# Patient Record
Sex: Female | Born: 1985 | Hispanic: Yes | Marital: Married | Smoking: Never smoker
Health system: Southern US, Community
[De-identification: ages and names within clinical notes are randomized; demographics above are authoritative.]

## PROBLEM LIST (undated history)

## (undated) DIAGNOSIS — N970 Female infertility associated with anovulation: Secondary | ICD-10-CM

## (undated) DIAGNOSIS — N83209 Unspecified ovarian cyst, unspecified side: Secondary | ICD-10-CM

## (undated) HISTORY — DX: Unspecified ovarian cyst, unspecified side: N83.209

## (undated) HISTORY — DX: Female infertility associated with anovulation: N97.0

---

## 2005-01-29 ENCOUNTER — Ambulatory Visit: Payer: Self-pay | Admitting: Internal Medicine

## 2009-11-16 DIAGNOSIS — O321XX0 Maternal care for breech presentation, not applicable or unspecified: Secondary | ICD-10-CM

## 2010-04-21 ENCOUNTER — Ambulatory Visit: Payer: Self-pay | Admitting: Obstetrics and Gynecology

## 2010-06-06 ENCOUNTER — Ambulatory Visit: Payer: Self-pay | Admitting: Obstetrics and Gynecology

## 2010-06-09 ENCOUNTER — Inpatient Hospital Stay: Payer: Self-pay | Admitting: Obstetrics and Gynecology

## 2014-07-30 ENCOUNTER — Emergency Department: Payer: Self-pay | Admitting: Emergency Medicine

## 2014-07-30 LAB — CBC
HCT: 41.8 % (ref 35.0–47.0)
HGB: 13.7 g/dL (ref 12.0–16.0)
MCH: 29.7 pg (ref 26.0–34.0)
MCHC: 32.8 g/dL (ref 32.0–36.0)
MCV: 91 fL (ref 80–100)
Platelet: 229 10*3/uL (ref 150–440)
RBC: 4.61 10*6/uL (ref 3.80–5.20)
RDW: 13.2 % (ref 11.5–14.5)
WBC: 9 10*3/uL (ref 3.6–11.0)

## 2014-07-30 LAB — HCG, QUANTITATIVE, PREGNANCY: BETA HCG, QUANT.: 218 m[IU]/mL — AB

## 2014-07-31 LAB — COMPREHENSIVE METABOLIC PANEL
ALBUMIN: 4.2 g/dL (ref 3.4–5.0)
ANION GAP: 12 (ref 7–16)
Alkaline Phosphatase: 58 U/L
BUN: 12 mg/dL (ref 7–18)
Bilirubin,Total: 0.4 mg/dL (ref 0.2–1.0)
CALCIUM: 9.1 mg/dL (ref 8.5–10.1)
CREATININE: 0.69 mg/dL (ref 0.60–1.30)
Chloride: 105 mmol/L (ref 98–107)
Co2: 26 mmol/L (ref 21–32)
EGFR (African American): >60
EGFR (Non-African Amer.): >60
GLUCOSE: 81 mg/dL (ref 65–99)
Osmolality: 284 (ref 275–301)
POTASSIUM: 4.1 mmol/L (ref 3.5–5.1)
SGOT(AST): 27 U/L (ref 15–37)
SGPT (ALT): 16 U/L
Sodium: 143 mmol/L (ref 136–145)
Total Protein: 7.4 g/dL (ref 6.4–8.2)

## 2014-08-03 ENCOUNTER — Emergency Department: Payer: Self-pay | Admitting: Emergency Medicine

## 2014-08-03 LAB — HCG, QUANTITATIVE, PREGNANCY: Beta Hcg, Quant.: 185 m[IU]/mL — ABNORMAL HIGH

## 2014-08-06 ENCOUNTER — Other Ambulatory Visit: Payer: Self-pay | Admitting: Obstetrics and Gynecology

## 2014-08-06 LAB — HCG, QUANTITATIVE, PREGNANCY: Beta Hcg, Quant.: 155 m[IU]/mL — ABNORMAL HIGH

## 2014-08-14 ENCOUNTER — Other Ambulatory Visit: Payer: Self-pay | Admitting: Obstetrics and Gynecology

## 2014-08-14 LAB — HCG, QUANTITATIVE, PREGNANCY: Beta Hcg, Quant.: 22 m[IU]/mL — ABNORMAL HIGH

## 2014-08-16 ENCOUNTER — Encounter: Payer: Self-pay | Admitting: Obstetrics and Gynecology

## 2014-08-21 LAB — HCG, QUANTITATIVE, PREGNANCY: Beta Hcg, Quant.: 2 m[IU]/mL

## 2014-09-16 ENCOUNTER — Encounter: Payer: Self-pay | Admitting: Obstetrics and Gynecology

## 2015-03-09 NOTE — Consult Note (Signed)
PATIENT NAME:  Destiny Allen, Mayda MR#:  [id number] DATE OF BIRTH:  Jul 18, 1986  DATE OF CONSULTATION:  07/31/2014  REFERRING PHYSICIAN:   CONSULTING PHYSICIAN:  Suzy Bouchardhomas J. Lindsi Bayliss, MD  ATTENDING PHYSICIAN:  Enedina Finnerandolph N. Manson PasseyBrown, MD  HISTORY OF PRESENT ILLNESS:  This is a 29 year old gravida 2, para 1, patient's last menstrual period of 05/10/2014, being [facility] for abnormal rises in beta-hCG. The patient was seen the day prior to presenting to the Emergency Department. The patient has had vaginal bleeding since 08/25,  darkish brown bleeding. The patient developed some vague left lower quadrant pain over the last few days. Ultrasound performed on 07/31/2014, that showed an empty uterus. No gestational sac. No yolk sac. No fetal pole. There was a 1.6 x 1.3 x 1.3 cm mass adjacent to the left ovary, also with the appearance of a gestational sac and embryo without cardiac motion noted. There is moderate free pelvic fluid noticed as well. Quantitative hCGs starting on 07/24/2014 equals 269, Repeat on 07/26/2014 was 427, followed by repeat on 07/29/2014 equals 317, and repeat this morning on 07/31/2014 equals 218. Blood type is A+. Hematocrit is 41.8, platelets 229,000.   PAST MEDICAL HISTORY: Unremarkable.   PAST SURGICAL HISTORY: Cesarean section.   REVIEW OF SYSTEMS: Unremarkable.   MEDICATIONS: Prenatal vitamins.   SOCIAL HISTORY: Does not smoke, does not drink.   PHYSICAL EXAMINATION:  GENERAL: Well-developed, well-nourished Hispanic female in no acute distress.  VITAL SIGNS: Temperature 97.8, blood pressure 115/86, pulse of 81, pulse oximetry of 100%.  LUNGS: Clear to auscultation.  CARDIOVASCULAR: Regular rate and rhythm. Soft, nontender. No rebound tenderness noted.  PELVIC: Bimanual pelvic exam: Cervix: No lesions. Uterus approximately 6 weeks in size, slightly tender on the left. No mass appreciated. Brown blood on examining glove.  RECTAL: Deferred.   ASSESSMENT:  Probable left fallopian tube ectopic pregnancy.   PLAN: Discussed with the patient the options of methotrexate treatment. The patient does meet the criteria. We did briefly speak about the role of surgical intervention. The patient is willing to undergo methotrexate treatment, which will be based on her height of 4 feet 11 inches, her weight of 122, BSA of 1.52, and methotrexate dosing of 50 mg per BSA is equal to 76 mg intramuscular. The patient will undergo a comprehensive metabolic panel to ensure she has no active liver disease and has normal renal function before if administration. The patient will have a repeat beta-hCG on 08/04/2014, and then a repeat hCG level on 08/07/2014, looking for at least a 15% decrease from day 4 through 7 hCG. The patient is given strict precautions if she has significant change in her pelvic pain to return to the Emergency Department. The patient is aware of the failure rate of methotrexate, approximately 5 to 10%, which would require either repeat methotrexate dosing versus surgical intervention. The patient is instructed to stop all prenatal vitamins and avoid green leafy vegetables in her diet until resolved of this ectopic pregnancy. All questions have been answered. Dr. Manson PasseyBrown has been given the further instructions for blood testing and methotrexate dosing.    ____________________________ Suzy Bouchardhomas J. Haruye Lainez, MD tjs:ts D: 07/31/2014 02:55:48 ET T: 07/31/2014 03:53:53 ET JOB#: [id number]  cc: Suzy Bouchardhomas J. Pearley Baranek, MD, <Dictator> Suzy BouchardHOMAS J Naturi Alarid MD ELECTRONICALLY SIGNED 07/31/2014 22:08

## 2015-09-19 ENCOUNTER — Ambulatory Visit (INDEPENDENT_AMBULATORY_CARE_PROVIDER_SITE_OTHER): Payer: Managed Care, Other (non HMO) | Admitting: Obstetrics and Gynecology

## 2015-09-19 ENCOUNTER — Encounter: Payer: Self-pay | Admitting: Obstetrics and Gynecology

## 2015-09-19 VITALS — BP 115/73 | HR 97 | Ht 59.0 in | Wt 127.3 lb

## 2015-09-19 DIAGNOSIS — N926 Irregular menstruation, unspecified: Secondary | ICD-10-CM

## 2015-09-19 DIAGNOSIS — N970 Female infertility associated with anovulation: Secondary | ICD-10-CM | POA: Diagnosis not present

## 2015-09-19 MED ORDER — MEDROXYPROGESTERONE ACETATE 10 MG PO TABS: 10.0000 mg | ORAL_TABLET | Freq: Every day | ORAL | Status: DC

## 2015-09-19 NOTE — Patient Instructions (Signed)
1. You will begin a medication called Provera.  You will take this medication for 7 days each month.  Begin next dose approximately 21 days from initial dose each month.  2. You will need to keep a menstrual calendar.  Be sure to mark the first day of your cycle each month.  3. On the 3rd day of your cycle (3rd day of bleeding) and 21st day of your cycle, you will need to return for lab work.  4. You will be scheduled for an ultrasound and and HSG (Hysterosalpingogram) to evaluate your pelvic organs and ensure no tubal blockage.  5. Once you have begun having regular cycles, you will need to be sure to have intercourse at least once daily specifically on Days 11-14 of your cycle.  Do not immediately get up after intercourse, remain lying down for 30 minutes.  6.  If all other labs are normal, your partner may need to undergo a semen analysis (sperm test)

## 2015-09-21 NOTE — Progress Notes (Signed)
GYNECOLOGY CLINIC PROGRESS NOTE  Subjective:    Destiny Allen is a 29 y.o. 292P1011 female who presents for evaluation of infertility. Patient and partner have been attempting conception for 1 year. Marital status: married for 8 years. Pregnancies with current partner: yes.    Menstrual and Endocrine History LMP Patient's last menstrual period was 07/22/2015.  Menarche 10  Shortest interval 28  Longest interval 60  days  Duration of flow 4 days  Heavy menses no  Clots no  Intermenstrual bleeding no  Postcoital bleeding no  Dysmenorrhea yes  Amenorrhea yes for 2 cycles  Weight change no  Hirsutism no  Balding no  Acne no  Galactorrhea no   Obstetrical History History: Ectopic pregnancies: 1 D&Cs: none Years took to conceive last pregnancy: 6 months   Obstetric History   G2   P1   T1   P0   A1   TAB0   SAB1   E0   M0   L1     # Outcome Date GA Lbr Len/2nd Weight Sex Delivery Anes PTL Lv  2 SAB 07/17/14        ND  1 Term 2011 9060w0d  7 lb (3.175 kg) M CS-Unspec   Y      Gynecologic History Last PAP 2 year ago  Previous abdominal or pelvic surgery yes  Pelvic pain no  Endometriosis no  Hot flashes no  DES exposure no  Abnormal Pap no  Cervix Cryo/cone no  Sexually transmitted diseases no  Pelvic inflammatory disease no   Infertility and Endocrine Studies Patient has not sought care previously for infertility. No h/o previous studies.   Sexual History Frequency 1 or 2 times per week  Satisfied yes  Dyspareunia no  Use of lubricant no  Douching no   Contraception None   Family History Thyroid problems  no  Heart condition or high blood pressure  yes  Blood clot or stroke  no  Diabetes  no  Cancer  no  Birth defects/inherited diseases  no  Infectious diseases (mumps, TB, rubella)  no  Other medical problems  no   Habits Cigarettes:    Wife -  no    Husband - no Alcohol:    Wife -  no    Husband - occasional (social drinker) Marijuana:   Wife -  no   Husband - no  The following portions of the patient's history were reviewed and updated as appropriate: allergies, current medications, past family history, past medical history, past social history, past surgical history and problem list.   Review of Systems A comprehensive review of systems was negative.   Female History and Exam Age: [age] Education: [employer]  Paternity of Pregnancies: Number with this partner: 1 Number with other partners: 0 Age of youngest child: 5 years  Urologic History: Infection no  STD no  Mumps no  Varicocele no  Semen analysis no  Undescended testes no  Testicular trauma no  Genital surgery no  Ejaculatory problem no  Impotence no         Objective:    Female Exam BP 115/73 mmHg  Pulse 97  Ht 4\' 11"  (1.499 m)  Wt 127 lb 4.8 oz (57.743 kg)  BMI 25.70 kg/m2  LMP 07/22/2015 Wt Readings from Last 1 Encounters:  09/19/15 127 lb 4.8 oz (57.743 kg)   BMI: Body mass index is 25.7 kg/(m^2). BP 115/73 mmHg  Pulse 97  Ht 4\' 11"  (1.499 m)  Wt 127 lb  4.8 oz (57.743 kg)  BMI 25.70 kg/m2  LMP 07/22/2015  General Appearance:    Alert, cooperative, no distress, appears stated age  Head:    Normocephalic, without obvious abnormality, atraumatic  Eyes:    PERRL, conjunctiva/corneas clear, EOM's intact, both eyes  Ears:    Normal external ear canals, both ears  Nose:   Nares normal, septum midline, mucosa normal, no drainage    or sinus tenderness  Throat:   Lips, mucosa, and tongue normal; teeth and gums normal  Neck:   Supple, symmetrical, trachea midline, no adenopathy; thyroid:  No enlargement/tenderness/nodules; no carotid bruit or JVD  Back:     Symmetric, no curvature, ROM normal, no CVA tenderness  Lungs:     Clear to auscultation bilaterally, respirations unlabored  Chest Wall:    No tenderness or deformity   Heart:    Regular rate and rhythm, S1 and S2 normal, no murmur, rub or gallop  Abdomen:     Soft, non-tender, bowel sounds  active all four quadrants, no masses, no organomegaly. Well healed Pfannenstiel incision.   Genitalia:    Normal female external genitalia without lesion. Vagina without discharge, normal mucosa. Cervix normal without lesions or tenderness. Uterus normal size and shape, mobile. Adnexae without masses or tenderness.   Rectal:    Normal external sphincter, no hemorrhoids.  Internal exam not performed.   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   H/o previous left ectopic pregnancy, treated with Methotrexate  Plan:   UPT ordered.  To start on Provera 10 mg for regulation of menstrual cycles.  Encouraged to keep a menstrual calendar.  FSH, LH, Progesterone 3 times in midluteal phase.  Will also order labs to r/o PCOS in light of patient's h/o irregular menses/anovulation. Ultrasound of pelvis. HSG to assess tubal patency Further management will depend upon the results of the above tests/procedures.  May include semen analysis, Follow up in 2 months after all studies complete.  Hildred Laser, MD [facility]

## 2015-11-19 ENCOUNTER — Ambulatory Visit: Payer: Managed Care, Other (non HMO) | Admitting: Obstetrics and Gynecology

## 2016-03-10 IMAGING — US US OB < 14 WEEKS - US OB TV
1 series · 13 of 28 positions shown · non-contrast
Comparison: None.

CLINICAL DATA: Vaginal bleeding and cramping. Quantitative beta HCG
weeks 5 days by LMP.

EXAM:
OBSTETRIC <14 WK US AND TRANSVAGINAL OB US
TECHNIQUE: Both transabdominal and transvaginal ultrasound examinations were
performed for complete evaluation of the gestation as well as the
maternal uterus, adnexal regions, and pelvic cul-de-sac.
Transvaginal technique was performed to assess early pregnancy.

[Series 1: us ob < 14 weeks - us ob tv · 0.17mm/px · 13 of 51 slices shown]
[im 2/51]
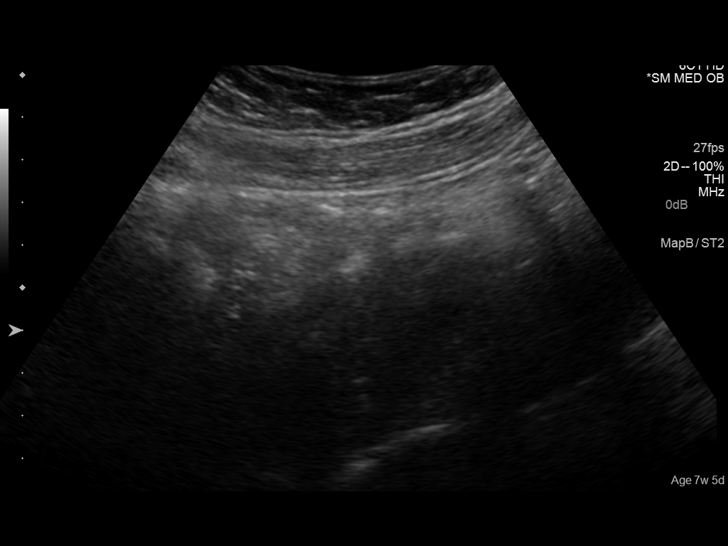
[im 6/51]
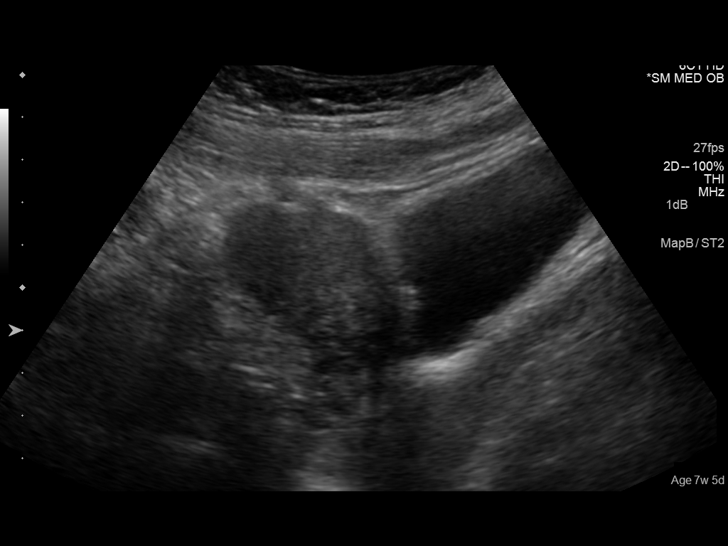
[im 10/51]
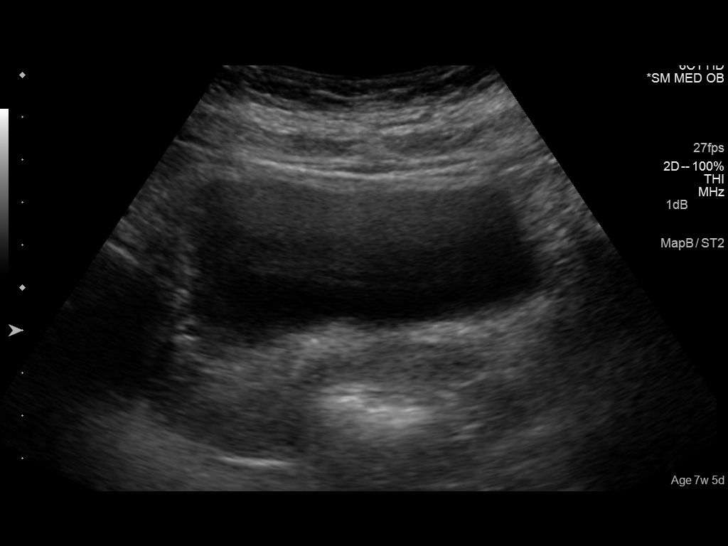
[im 13/51]
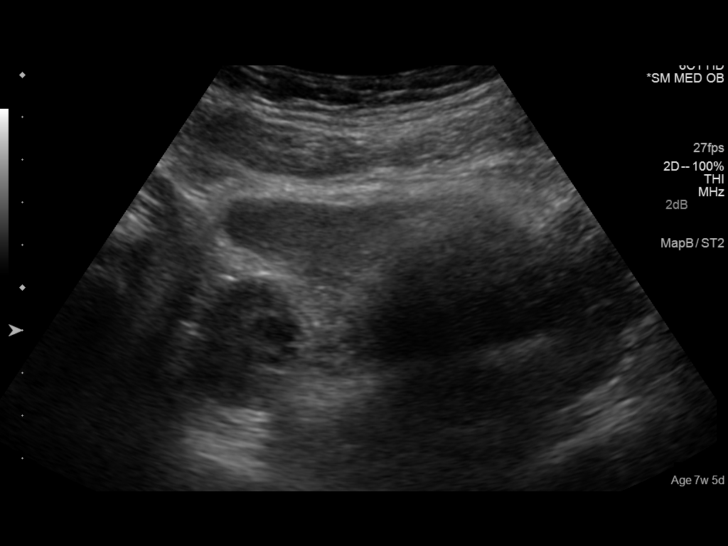
[im 17/51]
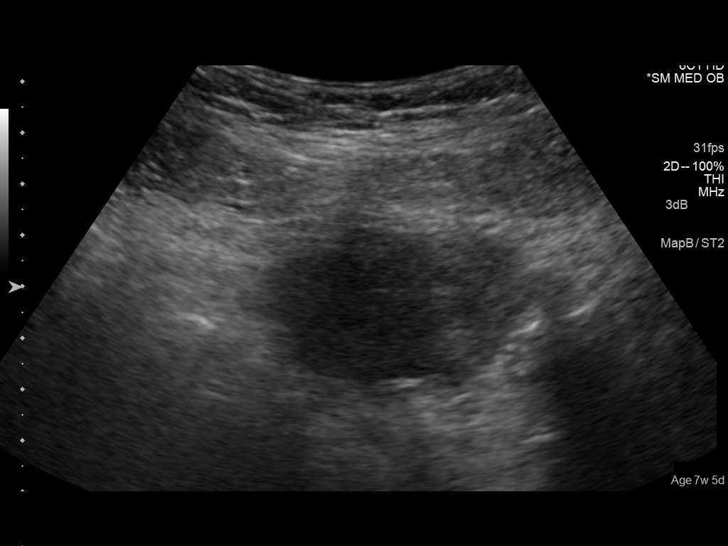
[im 21/51]
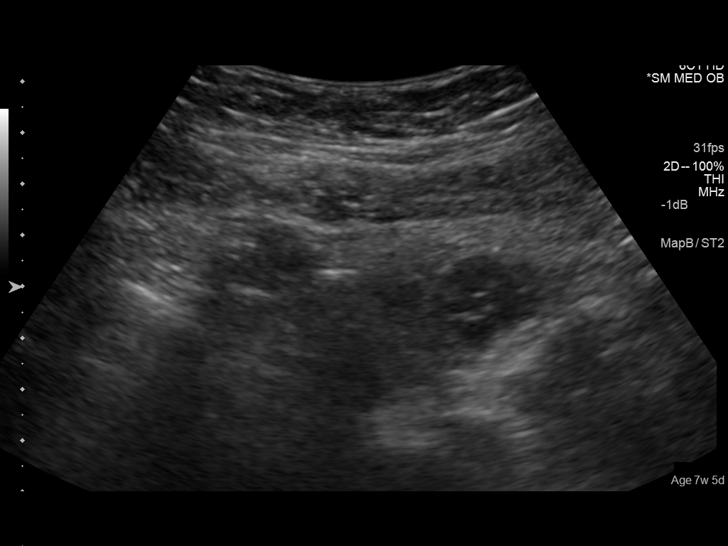
[im 26/51]
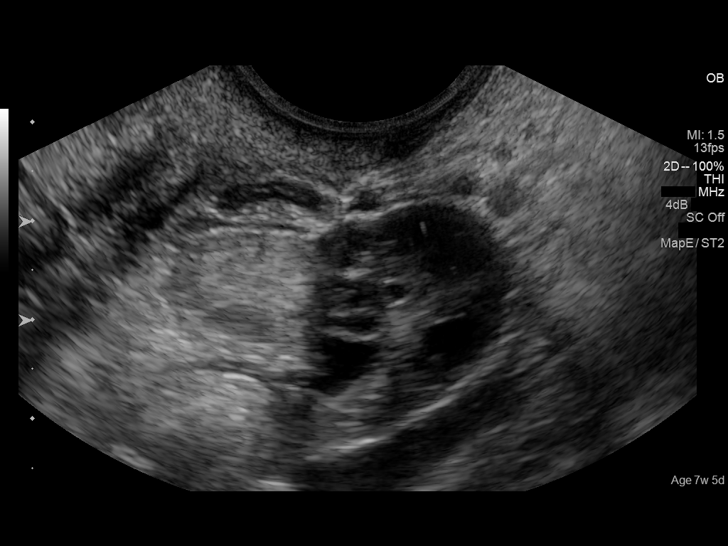
[im 30/51]
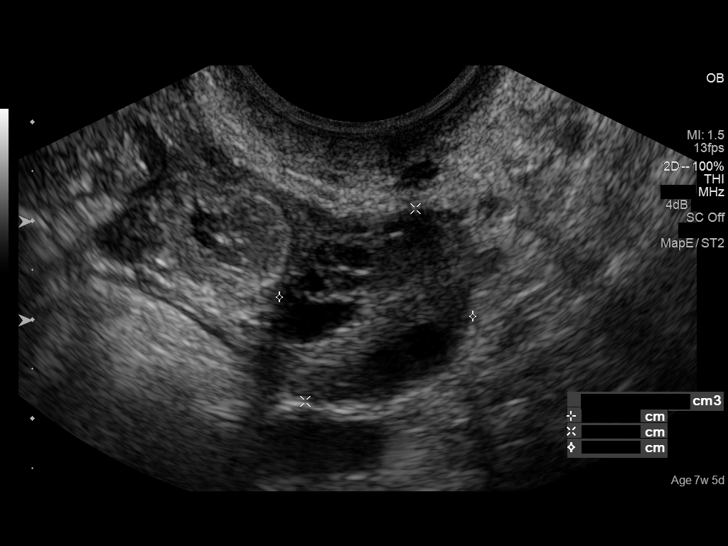
[im 34/51]
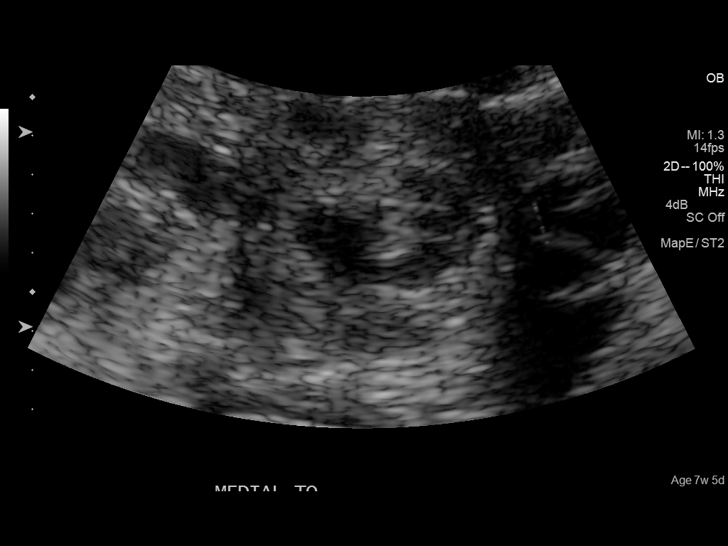
[im 38/51]
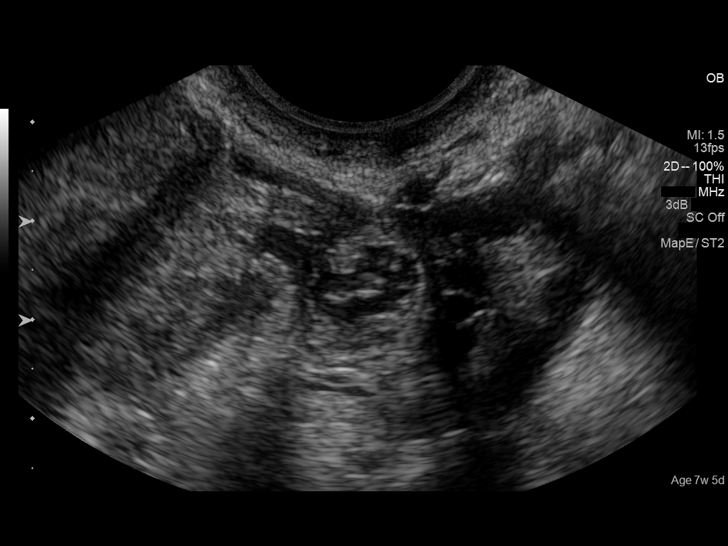
[im 41/51]
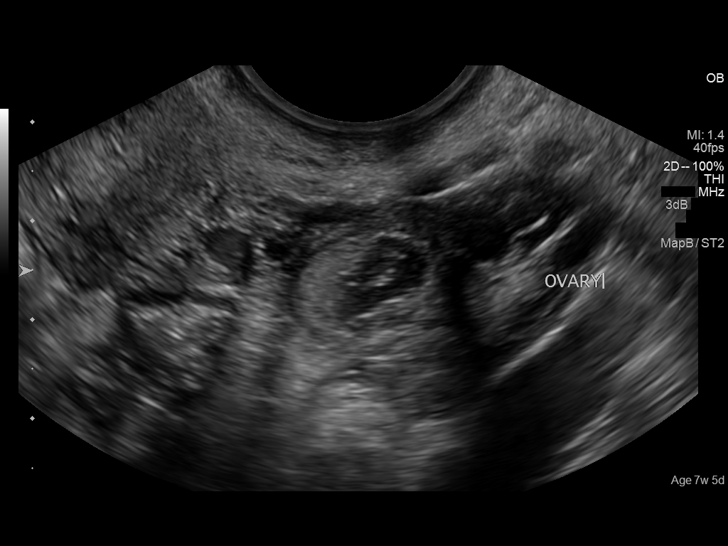
[im 45/51]
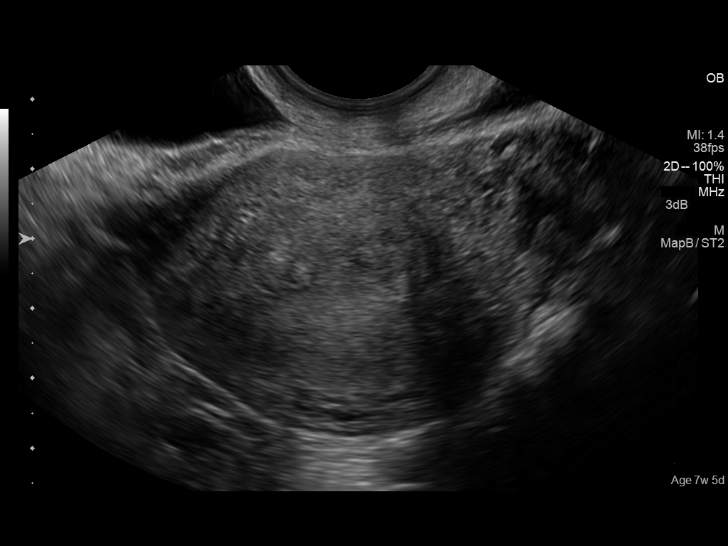
[im 49/51]
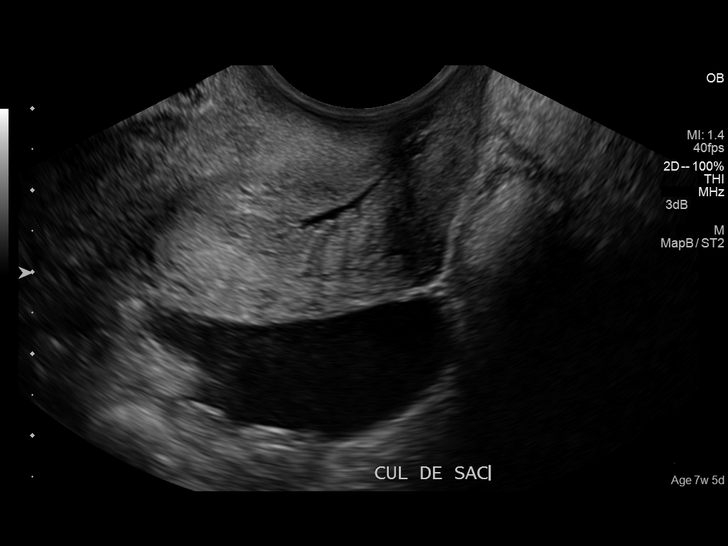

[13 of 28 positions shown; findings below may reference images not displayed]

FINDINGS: Intrauterine gestational sac: None

Yolk sac:  None

Embryo:  None

Maternal uterus/adnexae: Adjacent to the left ovary there is mass
suspicious for ectopic pregnancy which measures 1.6 x 1.3 x 1.3 cm.
Within this mass there is the appearance of a gestational sac and
embryo without cardiac motion. The adjacent left ovary has a normal
appearance. The right ovary has a normal appearance. There is
moderate free pelvic fluid which appears anechoic
IMPRESSION: 1. No intrauterine pregnancy.
2. Suspicious left adnexal mass raising the question of ectopic
pregnancy. Overall the mass measures 1.6 cm.
3. Moderate free pelvic fluid.
4. Critical Value/emergent results were called by telephone at the
time of interpretation on 07/31/2014 at [DATE] to Dr. KANORI
FONDONG , who verbally acknowledged these results.

## 2016-04-06 ENCOUNTER — Telehealth: Payer: Self-pay

## 2016-04-06 NOTE — Telephone Encounter (Signed)
Received a message from Dr. Vergie LivingPickens, would like patient seen in the office, called patient to schedule this appointment, no answer, left voice mail instructing patient to return my call here at the office.

## 2016-04-08 ENCOUNTER — Telehealth: Payer: Self-pay

## 2016-04-08 NOTE — Telephone Encounter (Signed)
Received a message from Dr. Vergie LivingPickens to contact patient to schedule an appt at our office around June. Called patient, no answer, unable to leave voice mail "mail is full"

## 2016-04-14 ENCOUNTER — Telehealth: Payer: Self-pay

## 2016-04-14 NOTE — Telephone Encounter (Signed)
Following up on a request from Dr. Vergie LivingPickens to contact patient to schedule an appointment, called patient, no answer, unable to leave message, "mailbox full"

## 2016-06-11 ENCOUNTER — Other Ambulatory Visit: Payer: Self-pay | Admitting: Obstetrics and Gynecology

## 2016-06-11 DIAGNOSIS — O3680X0 Pregnancy with inconclusive fetal viability, not applicable or unspecified: Secondary | ICD-10-CM

## 2016-06-16 ENCOUNTER — Ambulatory Visit
Admission: RE | Admit: 2016-06-16 | Discharge: 2016-06-16 | Disposition: A | Discharge status: Home / Self Care | Payer: 59 | Source: Ambulatory Visit | Attending: Obstetrics and Gynecology | Admitting: Obstetrics and Gynecology

## 2016-06-16 DIAGNOSIS — Z8759 Personal history of other complications of pregnancy, childbirth and the puerperium: Secondary | ICD-10-CM | POA: Diagnosis present

## 2016-06-16 DIAGNOSIS — Z331 Pregnant state, incidental: Secondary | ICD-10-CM | POA: Insufficient documentation

## 2016-06-16 DIAGNOSIS — O3480 Maternal care for other abnormalities of pelvic organs, unspecified trimester: Secondary | ICD-10-CM | POA: Diagnosis not present

## 2016-06-16 DIAGNOSIS — O3680X0 Pregnancy with inconclusive fetal viability, not applicable or unspecified: Secondary | ICD-10-CM | POA: Diagnosis not present

## 2016-06-16 DIAGNOSIS — Z3A00 Weeks of gestation of pregnancy not specified: Secondary | ICD-10-CM | POA: Insufficient documentation

## 2016-06-17 ENCOUNTER — Other Ambulatory Visit
Admission: RE | Admit: 2016-06-17 | Discharge: 2016-06-17 | Disposition: A | Discharge status: Home / Self Care | Payer: 59 | Source: Ambulatory Visit | Attending: Obstetrics and Gynecology | Admitting: Obstetrics and Gynecology

## 2016-06-17 DIAGNOSIS — O001 Tubal pregnancy without intrauterine pregnancy: Secondary | ICD-10-CM | POA: Insufficient documentation

## 2016-06-17 LAB — CBC
HCT: 37.3 % (ref 35.0–47.0)
Hemoglobin: 12.7 g/dL (ref 12.0–16.0)
MCH: 30 pg (ref 26.0–34.0)
MCHC: 34 g/dL (ref 32.0–36.0)
MCV: 88.1 fL (ref 80.0–100.0)
PLATELETS: 207 10*3/uL (ref 150–440)
RBC: 4.24 MIL/uL (ref 3.80–5.20)
RDW: 13.7 % (ref 11.5–14.5)
WBC: 8.7 10*3/uL (ref 3.6–11.0)

## 2016-06-17 LAB — COMPREHENSIVE METABOLIC PANEL
ALT: 22 U/L (ref 14–54)
AST: 25 U/L (ref 15–41)
Albumin: 4.6 g/dL (ref 3.5–5.0)
Alkaline Phosphatase: 48 U/L (ref 38–126)
Anion gap: 9 (ref 5–15)
BUN: 9 mg/dL (ref 6–20)
CHLORIDE: 103 mmol/L (ref 101–111)
CO2: 26 mmol/L (ref 22–32)
Calcium: 9.6 mg/dL (ref 8.9–10.3)
Creatinine, Ser: 0.61 mg/dL (ref 0.44–1.00)
Glucose, Bld: 90 mg/dL (ref 65–99)
POTASSIUM: 4 mmol/L (ref 3.5–5.1)
SODIUM: 138 mmol/L (ref 135–145)
Total Bilirubin: 0.9 mg/dL (ref 0.3–1.2)
Total Protein: 7.2 g/dL (ref 6.5–8.1)

## 2016-06-17 LAB — HCG, QUANTITATIVE, PREGNANCY: hCG, Beta Chain, Quant, S: 24852 m[IU]/mL — ABNORMAL HIGH (ref <5)

## 2016-06-23 ENCOUNTER — Telehealth: Payer: Self-pay | Admitting: *Deleted

## 2016-06-23 ENCOUNTER — Other Ambulatory Visit: Payer: Self-pay | Admitting: Internal Medicine

## 2016-06-23 ENCOUNTER — Encounter: Payer: Self-pay | Admitting: *Deleted

## 2016-06-23 DIAGNOSIS — O009 Unspecified ectopic pregnancy without intrauterine pregnancy: Secondary | ICD-10-CM

## 2016-06-23 DIAGNOSIS — Z8759 Personal history of other complications of pregnancy, childbirth and the puerperium: Secondary | ICD-10-CM | POA: Insufficient documentation

## 2016-06-23 NOTE — Telephone Encounter (Signed)
Dr. Donneta RombergBrahmanday spoke with Dr. Jean RosenthalJackson a few mins. Ago. Pt will need methotrexate inj today. Herbert SetaHeather, RN Spoke with Steward DroneBrenda, RN in cancer ctr triage--pending rx to be fax at [phone] .  Steward DroneBrenda will look out for this/ fax for Dr. Donneta RombergBrahmanday. Per Dr. Sharman CrateBrahmanday-labs from 8/2 are acceptable. No need to repeat any additional lab values today.

## 2016-06-23 NOTE — Progress Notes (Signed)
I spoke to Dr.jackson re: pt with ectopic pregnancy; we will await the fax from Dr.Jackson's office re: prescription/dose. I have put orders for methotrexate.   Addendum: Spoke to Dr.Jackson; wants to hold off Mxt treatment today; He will let us know if he want to do it tomorrow. Spoke to Sprint Nextel CorporationKim in infusion.

## 2016-06-23 NOTE — Telephone Encounter (Signed)
Contacted Dr. Edison PaceJackson's office per v/o Dr. Donneta RombergBrahmanday. Obtained ht/wt.  Pt's last height in July 4'11".  Last recorded wt-  133 lbs

## 2016-06-23 NOTE — Progress Notes (Signed)
Contacted [facility]. Pt's last height in July 4'11".  Last recorded wt-  133 lbs

## 2016-06-24 ENCOUNTER — Inpatient Hospital Stay: Payer: 59 | Attending: Internal Medicine

## 2016-06-24 ENCOUNTER — Other Ambulatory Visit: Payer: Self-pay | Admitting: *Deleted

## 2016-06-24 ENCOUNTER — Other Ambulatory Visit: Payer: Self-pay | Admitting: Internal Medicine

## 2016-06-24 VITALS — BP 108/70 | HR 88 | Temp 98.4°F

## 2016-06-24 DIAGNOSIS — Z79899 Other long term (current) drug therapy: Secondary | ICD-10-CM | POA: Insufficient documentation

## 2016-06-24 DIAGNOSIS — O009 Unspecified ectopic pregnancy without intrauterine pregnancy: Secondary | ICD-10-CM

## 2016-06-24 MED ORDER — METHOTREXATE SODIUM (PF) CHEMO INJECTION 250 MG/10ML
50.0000 mg/m2 | Freq: Once | INTRAMUSCULAR | Status: AC
  Administered 2016-06-24: 79 mg via INTRAMUSCULAR
  Filled 2016-06-24: qty 3.16

## 2016-06-24 MED ORDER — PROCHLORPERAZINE MALEATE 10 MG PO TABS
10.0000 mg | ORAL_TABLET | Freq: Once | ORAL | Status: AC
  Administered 2016-06-24: 10 mg via ORAL
  Filled 2016-06-24: qty 1

## 2016-06-27 ENCOUNTER — Other Ambulatory Visit
Admission: RE | Admit: 2016-06-27 | Discharge: 2016-06-27 | Disposition: A | Discharge status: Home / Self Care | Payer: 59 | Source: Ambulatory Visit | Attending: Obstetrics and Gynecology | Admitting: Obstetrics and Gynecology

## 2016-06-27 DIAGNOSIS — O009 Unspecified ectopic pregnancy without intrauterine pregnancy: Secondary | ICD-10-CM | POA: Diagnosis present

## 2016-06-27 LAB — HCG, QUANTITATIVE, PREGNANCY: hCG, Beta Chain, Quant, S: 25896 m[IU]/mL — ABNORMAL HIGH (ref <5)

## 2016-06-30 ENCOUNTER — Other Ambulatory Visit
Admission: RE | Admit: 2016-06-30 | Discharge: 2016-06-30 | Disposition: A | Discharge status: Home / Self Care | Payer: 59 | Source: Ambulatory Visit | Attending: Obstetrics and Gynecology | Admitting: Obstetrics and Gynecology

## 2016-06-30 DIAGNOSIS — O009 Unspecified ectopic pregnancy without intrauterine pregnancy: Secondary | ICD-10-CM | POA: Insufficient documentation

## 2016-06-30 LAB — COMPREHENSIVE METABOLIC PANEL
ALBUMIN: 4.3 g/dL (ref 3.5–5.0)
ALK PHOS: 45 U/L (ref 38–126)
ALT: 23 U/L (ref 14–54)
ANION GAP: 6 (ref 5–15)
AST: 20 U/L (ref 15–41)
BUN: 10 mg/dL (ref 6–20)
CHLORIDE: 103 mmol/L (ref 101–111)
CO2: 26 mmol/L (ref 22–32)
Calcium: 9.5 mg/dL (ref 8.9–10.3)
Creatinine, Ser: 0.51 mg/dL (ref 0.44–1.00)
GFR calc non Af Amer: >60 mL/min (ref >60)
GLUCOSE: 83 mg/dL (ref 65–99)
Potassium: 3.7 mmol/L (ref 3.5–5.1)
SODIUM: 135 mmol/L (ref 135–145)
Total Bilirubin: 0.5 mg/dL (ref 0.3–1.2)
Total Protein: 6.9 g/dL (ref 6.5–8.1)

## 2016-06-30 LAB — CBC
HCT: 35.9 % (ref 35.0–47.0)
HEMOGLOBIN: 12.2 g/dL (ref 12.0–16.0)
MCH: 29.9 pg (ref 26.0–34.0)
MCHC: 33.9 g/dL (ref 32.0–36.0)
MCV: 88.1 fL (ref 80.0–100.0)
PLATELETS: 209 10*3/uL (ref 150–440)
RBC: 4.07 MIL/uL (ref 3.80–5.20)
RDW: 13.5 % (ref 11.5–14.5)
WBC: 7 10*3/uL (ref 3.6–11.0)

## 2016-06-30 LAB — HCG, QUANTITATIVE, PREGNANCY: hCG, Beta Chain, Quant, S: 16616 m[IU]/mL — ABNORMAL HIGH (ref <5)

## 2017-09-27 ENCOUNTER — Ambulatory Visit (INDEPENDENT_AMBULATORY_CARE_PROVIDER_SITE_OTHER): Payer: 59 | Admitting: Advanced Practice Midwife

## 2017-09-27 ENCOUNTER — Encounter: Payer: Self-pay | Admitting: Advanced Practice Midwife

## 2017-09-27 VITALS — BP 122/74 | Wt 136.0 lb

## 2017-09-27 DIAGNOSIS — O0990 Supervision of high risk pregnancy, unspecified, unspecified trimester: Secondary | ICD-10-CM

## 2017-09-27 DIAGNOSIS — Z8632 Personal history of gestational diabetes: Secondary | ICD-10-CM

## 2017-09-27 DIAGNOSIS — Z98891 History of uterine scar from previous surgery: Secondary | ICD-10-CM

## 2017-09-27 DIAGNOSIS — Z113 Encounter for screening for infections with a predominantly sexual mode of transmission: Secondary | ICD-10-CM

## 2017-09-27 NOTE — Progress Notes (Signed)
New Obstetric Patient H&P    Chief Complaint: "Desires prenatal care"   History of Present Illness: Patient is a 31 y.o. 623P1011 Hispanic or Latino female, LMP 07/30/2017 presents with amenorrhea and positive home pregnancy test. Based on her  LMP, her EDD is Estimated Date of Delivery: 05/06/2017. and her EGA is 668w3d. Cycles are 6. days, regular, and occur approximately every : 28 days. Her last pap smear was 9 months ago and was no abnormalities.    She had a urine pregnancy test which was positive 2 week(s)  ago. Her last menstrual period was normal and lasted for  6 or 7 day(s). Since her LMP she claims she has experienced bloating and mild cramping. She denies vaginal bleeding. Her past medical history is noncontributory. Her prior pregnancies are notable for gestational diabetes- diet controlled, c/s for breech presentation  Since her LMP, she admits to the use of tobacco products  no She claims she has gained   4 pounds since the start of her pregnancy.  There are cats in the home in the home  no  She admits close contact with children on a regular basis  yes  She has had chicken pox in the past yes She has had Tuberculosis exposures, symptoms, or previously tested positive for TB   no Current or past history of domestic violence. no  Genetic Screening/Teratology Counseling: (Includes patient, baby's father, or anyone in either family with:)   1. [facility]  no 2. Thalassemia ([place], [place], Mediterranean, or Asian background): MCV<80  no 3. Neural tube defect (meningomyelocele, spina bifida, anencephaly)  no 4. Congenital heart defect  no  5. Down syndrome  no 6. Tay-Sachs (Jewish, [place])  no 7. Canavan's Disease  no 8. Sickle cell disease or trait (African)  no  9. Hemophilia or other blood disorders  no  10. Muscular dystrophy  no  11. Cystic fibrosis  no  12. Huntington's Chorea  no  13. Mental retardation/autism  no 14. Other inherited  genetic or chromosomal disorder  no 15. Maternal metabolic disorder (DM, PKU, etc)  no 16. Patient or FOB with a child with a birth defect not listed above no  16a. Patient or FOB with a birth defect themselves no 17. Recurrent pregnancy loss, or stillbirth  no  18. Any medications since LMP other than prenatal vitamins (include vitamins, supplements, OTC meds, drugs, alcohol)  no 19. Any other genetic/environmental exposure to discuss  no  Infection History:   1. Lives with someone with TB or TB exposed  no  2. Patient or partner has history of genital herpes  no 3. Rash or viral illness since LMP  no 4. History of STI (GC, CT, HPV, syphilis, HIV)  no 5. History of recent travel :  no  Other pertinent information:  Plans to travel to [place] for Christmas/New Year's- Zika precautions given    Review of Systems:10 point review of systems negative unless otherwise noted in HPI  Past Medical History:  Past Medical History:  Diagnosis Date  . Infertility associated with anovulation   . Ovarian cyst     Past Surgical History:  Past Surgical History:  Procedure Laterality Date  . CESAREAN SECTION  2011    Gynecologic History: Patient's last menstrual period was 07/30/2017.  Obstetric History: G3P1011  Family History:  Family History  Problem Relation Age of Onset  . Hypertension Father     Social History:  Social History  Socioeconomic History  . Marital status: Married    Spouse name: Not on file  . Number of children: Not on file  . Years of education: Not on file  . Highest education level: Not on file  Social Needs  . Financial resource strain: Not on file  . Food insecurity - worry: Not on file  . Food insecurity - inability: Not on file  . Transportation needs - medical: Not on file  . Transportation needs - non-medical: Not on file  Occupational History  . Not on file  Tobacco Use  . Smoking status: Never Smoker  . Smokeless tobacco: Never Used    Substance and Sexual Activity  . Alcohol use: No  . Drug use: No  . Sexual activity: Yes    Birth control/protection: None  Other Topics Concern  . Not on file  Social History Narrative  . Not on file    Allergies:  No Known Allergies  Medications: Prior to Admission medications   Medication Sig Start Date End Date Taking? Authorizing Provider  IRON PO Take by mouth.    [provider]  medroxyPROGESTERone (PROVERA) 10 MG tablet Take 1 tablet (10 mg total) by mouth daily. Patient not taking: Reported on 09/27/2017 09/19/15   Hildred Laserherry, Anika, MD    Physical Exam Vitals: Blood pressure 122/74, weight 136 lb (61.7 kg), last menstrual period 07/30/2017.  General: NAD HEENT: normocephalic, anicteric Thyroid: no enlargement, no palpable nodules Pulmonary: No increased work of breathing, CTAB Cardiovascular: RRR, distal pulses 2+ Abdomen: NABS, soft, non-tender, non-distended.  Umbilicus without lesions.  No hepatomegaly, splenomegaly or masses palpable. No evidence of hernia  Genitourinary:  Deferred for no concerns/early gestational age/PAP interval Extremities: no edema, erythema, or tenderness Neurologic: Grossly intact Psychiatric: mood appropriate, affect full   Assessment: 31 y.o. G3P1011 at 5456w3d per LMP presenting to initiate prenatal care  Plan: 1) Avoid alcoholic beverages. 2) Patient encouraged not to smoke.  3) Discontinue the use of all non-medicinal drugs and chemicals.  4) Take prenatal vitamins daily.  5) Nutrition, food safety (fish, cheese advisories, and high nitrite foods) and exercise discussed. 6) Hospital and practice style discussed with cross coverage system.  7) Genetic Screening, such as with 1st Trimester Screening, cell free fetal DNA, AFP testing, and Ultrasound, as well as with amniocentesis and CVS as appropriate, is discussed with patient. At the conclusion of today's visit patient requested genetic testing 8) Patient is asked about  travel to areas at risk for the Zika virus, and counseled to avoid travel and exposure to mosquitoes or sexual partners who may have themselves been exposed to the virus. Testing is discussed, and will be ordered as appropriate.   Tresea MallJane Ether Wolters, CNM

## 2017-09-27 NOTE — Patient Instructions (Addendum)

## 2017-09-27 NOTE — Progress Notes (Signed)
NOB today. No vb. No lof ?

## 2017-09-29 LAB — GC/CHLAMYDIA PROBE AMP
Chlamydia trachomatis, NAA: NEGATIVE
Neisseria gonorrhoeae by PCR: NEGATIVE

## 2017-09-29 LAB — URINE CULTURE: ORGANISM ID, BACTERIA: NO GROWTH

## 2017-10-06 ENCOUNTER — Encounter: Payer: Self-pay | Admitting: Advanced Practice Midwife

## 2017-10-06 ENCOUNTER — Ambulatory Visit (INDEPENDENT_AMBULATORY_CARE_PROVIDER_SITE_OTHER): Payer: 59

## 2017-10-06 ENCOUNTER — Other Ambulatory Visit: Payer: 59

## 2017-10-06 ENCOUNTER — Ambulatory Visit (INDEPENDENT_AMBULATORY_CARE_PROVIDER_SITE_OTHER): Payer: 59 | Admitting: Advanced Practice Midwife

## 2017-10-06 VITALS — BP 118/74 | Wt 138.0 lb

## 2017-10-06 DIAGNOSIS — O0990 Supervision of high risk pregnancy, unspecified, unspecified trimester: Secondary | ICD-10-CM

## 2017-10-06 DIAGNOSIS — Z362 Encounter for other antenatal screening follow-up: Secondary | ICD-10-CM

## 2017-10-06 DIAGNOSIS — Z3A01 Less than 8 weeks gestation of pregnancy: Secondary | ICD-10-CM

## 2017-10-06 DIAGNOSIS — Z113 Encounter for screening for infections with a predominantly sexual mode of transmission: Secondary | ICD-10-CM

## 2017-10-06 DIAGNOSIS — O34219 Maternal care for unspecified type scar from previous cesarean delivery: Secondary | ICD-10-CM

## 2017-10-06 DIAGNOSIS — Z8632 Personal history of gestational diabetes: Secondary | ICD-10-CM

## 2017-10-06 NOTE — Progress Notes (Signed)
  Routine Prenatal Care Visit  Subjective  Destiny Allen is a 31 y.o. G3P1011 at Unknown being seen today for ongoing prenatal care.  She is currently monitored for the following issues for this high-risk pregnancy and has Ectopic pregnancy and Supervision of high risk pregnancy, antepartum on their problem list.  ----------------------------------------------------------------------------------- Patient reports mild nausea. She is unsure about her travel plans to [place] due to [place] risk. .    [name]. Vag. Bleeding: None.   . Denies leaking of fluid.  ----------------------------------------------------------------------------------- The following portions of the patient's history were reviewed and updated as appropriate: allergies, current medications, past family history, past medical history, past social history, past surgical history and problem list. Problem list updated.   Objective  Blood pressure 118/74, weight 138 lb (62.6 kg), last menstrual period 07/30/2017. Pregravid weight 132 lb (59.9 kg) Total Weight Gain 6 lb (2.722 kg) Urinalysis: Urine Protein: Negative Urine Glucose: Negative  Fetal Status: Dating scan today measures 2 weeks different. EDD adjusted 1847w4d by u/s today. 3745w5d by LMP Gestational sac appears slightly flattened per u/s report  General:  Alert, oriented and cooperative. Patient is in no acute distress.  Skin: Skin is warm and dry. No rash noted.   Cardiovascular: Normal heart rate noted  Respiratory: Normal respiratory effort, no problems with respiration noted  Abdomen: Soft, gravid, appropriate for gestational age. Pain/Pressure: Absent     Pelvic:  Cervical exam deferred        Extremities: Normal range of motion.     Mental Status: Normal mood and affect. Normal behavior. Normal judgment and thought content.   Assessment   31 y.o. G3P1011 at Unknown by  Not found. presenting for routine prenatal visit  Plan   pregnancy Problems (from 09/27/17 to  present)    No problems associated with this episode.       Preterm labor symptoms and general obstetric precautions including but not limited to vaginal bleeding, contractions, leaking of fluid and fetal movement were reviewed in detail with the patient. Please refer to After Visit Summary for other counseling recommendations.   Return in about 4 weeks (around 11/03/2017) for 1st trimester screen and rob.  Tresea MallJane Sheridan Gettel, CNM  10/06/2017 3:22 PM

## 2017-10-06 NOTE — Progress Notes (Signed)
Dating scan today. No vb. No lof.  °

## 2017-10-06 NOTE — Patient Instructions (Signed)
Embarazo y enfermedad por el virus del Zika (Pregnancy and Zika Virus Disease) Las personas pueden contraer la enfermedad por el virus del [place], o el zika, a travs de los mosquitos que transmiten el virus. Tambin puede transmitirse de Neomia Dear persona a la otra a travs de lquidos corporales infectados. El primer caso de zika se produjo en frica, pero recientemente la enfermedad se ha propagado a nuevas regiones. El virus aparece en climas tropicales. El zika sigue expandindose a diferentes ubicaciones. La [employer] de las personas infectadas por el virus del Zika no tienen una enfermedad grave. Sin embargo, el zika puede causar defectos congnitos en un feto cuya madre contrajo el virus. Tambin puede aumentar el riesgo de aborto espontneo. CULES SON LOS SNTOMAS DE LA ENFERMEDAD POR EL VIRUS DEL ZIKA? En muchos casos, las personas infectadas por el virus del Zika no tienen sntomas. Si estos aparecen, por lo general comienzan alrededor de una semana despus de que la persona contrae la infeccin. Los sntomas suelen ser leves. Estos pueden incluir los siguientes:  Teacher, English as a foreign language.  Erupcin cutnea.  Ojos rojos.  Dolor en las articulaciones. CMO SE TRANSMITE LA ENFERMEDAD POR EL VIRUS DEL ZIKA? El virus del [place] se transmite principalmente a travs de la picadura de un determinado tipo de mosquito. A diferencia de la mayora de los tipos de mosquitos, que solo pican por la noche, Optometrist del virus del Zika pica tanto durante la noche como durante Medical laboratory scientific officer. El virus del [place] puede transmitirse a travs del contacto sexual, de una transfusin de [place] y de la madre al feto antes del parto o [place]. Una vez que ha tenido la enfermedad por el virus del [place], es poco probable que vuelva a Surveyor, minerals. PUEDO TRANSMITIRLE EL ZIKA AL BEB DURANTE EL EMBARAZO? S, la madre puede transmitirle el zika al beb antes o durante el [address]. QU PROBLEMAS PUEDE CAUSARLE EL ZIKA AL BEB? Una  mujer que contrae el virus del Zika mientras est embarazada corre riesgo de que el beb nazca con una enfermedad en la cual el cerebro o la cabeza son ms pequeos de lo esperado (microcefalia). Los bebs con microcefalia pueden tener retrasos en el desarrollo, convulsiones, problemas de audicin y [place] de visin. Si una mujer embarazada contrae el virus del [place], tiene un mayor riesgo de aborto espontneo. CMO PUEDE PREVENIRSE LA ENFERMEDAD POR EL VIRUS DEL ZIKA? No hay ninguna vacuna para prevenir el zika. La mejor forma de prevenir la enfermedad es evitar los mosquitos infectados y la exposicin a lquidos corporales que puedan transmitir el virus. Tenga en cuenta las siguientes precauciones para evitar cualquier posible exposicin al zika. Para las mujeres y sus parejas sexuales:  Evite viajar a zonas de Conservator, museum/gallery. Los lugares en los que se informan casos de zika cambian con frecuencia. Para identificar las regiones de alto riesgo, consulte el sitio web de viajes de los CDC: [email or web address]  Si usted o su pareja deben viajar a una zona de alto riesgo, consulten a un mdico antes y despus de Tourist information centre manager.  Si vive en cualquiera de las regiones de alto riesgo o debe viajar a una de estas zonas, tome todas las precauciones para evitar las picaduras de los mosquitos. Los repelentes para mosquitos se pueden Chemical engineer de forma segura Academic librarian.  Pregntele al mdico cundo podr tener contacto sexual de forma segura. Para las mujeres:  Si est embarazada o est intentando quedar embarazada, evite el contacto sexual con personas que pueden haber [place]  expuestos al virus del Zika, personas que tengan posibles sntomas del zika u personas que tengan antecedentes de los que usted no est segura. Si elige tener contacto sexual con una pareja masculina que puede haber estado expuesta al virus del [place], utilice preservativos de forma correcta durante toda la actividad sexual,  cada vez que tenga contacto sexual. No comparta los [place], ya que es posible quedar expuesto a los lquidos corporales.  Pregntele al mdico cundo puede buscar un embarazo sin correr riesgos despus de Neomia Dear posible exposicin al virus del Zika. QU MEDIDAS DEBO TOMAR PARA EVITAR LAS PICADURAS DE LOS MOSQUITOS? Tome estas medidas para evitar las picaduras de los mosquitos cuando est en zonas de alto riesgo:  Use ropa suelta que le cubra los brazos y las piernas.  Limite las actividades al [employer].  No abra las ventanas, a menos que tengan mosquiteros.  Duerma con una red para mosquitos.  Use repelente para insectos. Los mejores repelentes para insectos tienen estas caractersticas:  Incluyen DEET, picaridina, aceite de eucalipto de limn (OLE) o IR3535.  Contienen mayor cantidad de [place].  Recuerde que los repelentes para mosquitos se pueden Chemical engineer de forma segura durante el [place].  No use OLE en nios menores de 3aos. No use repelente para insectos en bebs menores de .  Cubra el cochecito del nio con un mosquitero. Asegrese de que el mosquitero se ajuste perfectamente y de que no haya partes flojas que cubran la boca o la nariz del beb. No use Manufacturing engineer los mosquitos.  No se aplique repelente para insectos debajo de la ropa.  Si [place] pantalla solar, aplquesela antes del repelente para insectos.  Trate la ropa con permetrina. No aplique permetrina directamente sobre la piel. Siga las indicaciones sobre el uso seguro que figuran en la etiqueta.  Deseche el agua estancada donde los mosquitos pueden reproducirse. Suele haber agua estancada en objetos como cubetas, tazones, platos para alimento de [place] y [place]. Al regresar de cualquiera de estas zonas, es necesario seguir tomando medidas para protegerse contra las picaduras de los mosquitos durante 3semanas, aunque no haya signos de la enfermedad. Esto evitar  que el virus del Zika se transmita a los mosquitos que no estn infectados. QU DEBO SABER SOBRE LA TRANSMISIN SEXUAL DEL ZIKA? Las Company secretary transmitir el zika a sus parejas sexuales durante el sexo vaginal, anal u oral, o al compartir [employer]. Muchas personas que tienen zika no manifiestan sntomas; por lo tanto, una persona podra diseminar la enfermedad sin saber que est infectada. El mayor riesgo es para las [place] y para las mujeres que pueden quedar [place]. El virus del [place] puede vivir ms tiempo en el semen que en la [place]. Las Personnel officer la transmisin del virus si hacen lo siguiente:  Usan siempre preservativos de Wellsite geologist correcta durante todo el tiempo que dure la actividad sexual. Esto incluye el sexo vaginal, anal y oral.  No comparten los objetos sexuales. Al compartirlos se incrementa el riesgo de exposicin a los lquidos corporales de Engineer, maintenance (IT).  Evitan toda la actividad sexual hasta que el mdico diga que no hay peligro. DEBO HACERME UN ANLISIS DE DETECCIN DEL VIRUS DEL ZIKA? Pueden extraerle sangre para detectar la presencia del virus del [place]. Si una mujer embarazada ha estado expuesta al virus o tiene sntomas del [place], debe realizarse el [place]. Tambin pueden hacerle otras pruebas durante el embarazo, por ejemplo, ecografas. Hable con el mdico [employer]  ms convenientes. Esta informacin no tiene Theme park managercomo fin reemplazar el consejo del mdico. Asegrese de hacerle al mdico cualquier pregunta que tenga. Document Released: 07/24/2015 Document Revised: 07/24/2015 Document Reviewed: 07/17/2015 Elsevier Interactive Patient Education  2018 Elsevier Inc. Cuidados prenatales (Prenatal Care) QU SON LOS CUIDADOS PRENATALES? Los cuidados prenatales son Sandi Mariscalaquellos que se brindan a una embarazada antes del Ogilvieparto. Los cuidados prenatales garantizan que la embarazada y el feto estn tan sanos como sea posible durante todo el Thurmanembarazo.  Pueden brindar Goodrich Corporationeste tipo de cuidados Conroyuna matrona, un mdico de atencin primaria o un especialista en parto y Psychiatristembarazo ([place] Londonobstetra). Los cuidados prenatales incluyen exmenes fsicos, estudios, tratamientos e informacin sobre nutricin, estilo de vida y servicios de apoyo social. POR QU SON TAN IMPORTANTES LOS CUIDADOS PRENATALES? Los cuidados prenatales recibidos desde un inicio y de forma peridica aumentan la probabilidad de que usted y el beb permanezcan sanos durante todo el Griggstownembarazo. Este tipo de cuidados tambin reduce el riesgo de que el beb nazca mucho antes de la fecha probable de parto (prematuro) o de que sea ms pequeo de lo previsto (pequeo para la edad gestacional). Durante las visitas prenatales, se Engineer, technical salesanaliza cualquier clase de enfermedad preexistente que usted pueda tener y que represente un riesgo durante el Psychiatristembarazo. Tambin la monitorearn con regularidad para Insurance risk surveyordetectar cualquier afeccin que pueda surgir Academic librariandurante el embarazo, a fin de tratarla con rapidez y eficacia. QU SUCEDE DURANTE LAS VISITAS PRENATALES? Las visitas prenatales pueden incluir lo siguiente: [place] Informe al mdico cualquier signo o sntoma nuevo que haya tenido desde la ltima visita. Estos pueden incluir los siguientes:  Nuseas o vmitos.  Aumento o disminucin del nivel de Riggstonenerga.  Dificultad para dormir.  Dolor en la espalda o las piernas.  Cambios en Altria Groupel peso.  Ganas frecuentes de Geographical information systems officerorinar.  Falta de aire al realizar actividad fsica.  Cambios en la piel, por ejemplo, una erupcin cutnea o picazn.  Sangrado o flujo vaginal.  Sensacin de excitacin o nerviosismo.  Cambios en los movimientos del feto. Es conveniente que escriba cualquier pregunta o tema del que quiera hablar con el mdico, para llevarlo anotado a la cita. Exmenes Durante la primera visita prenatal, es probable que le hagan un examen fsico completo. El Office Depotmdico le revisar con frecuencia la vagina, el cuello del tero y  la posicin del tero, adems de examinarle el corazn, los pulmones y otras partes del cuerpo. A medida que el embarazo avance, el mdico medir el tamao del tero y IT consultantverificar la posicin del feto dentro del tero. Tambin puede examinarla para Bed Bath & Beyonddetectar los primeros signos del West Uniontrabajo de Teaticketparto. Las visitas prenatales tambin pueden incluir el control de la presin arterial y, despus de 10 a 12semanas de embarazo, aproximadamente, el control de los latidos del feto. Estudios Los estudios habituales suelen incluir lo siguiente:  Anlisis de Comorosorina. Este anlisis examina la presencia de glucosa, protenas o signos de infeccin en la orina.  Recuento sanguneo. Este anlisis verifica el nivel de glbulos rojos y blancos en el organismo.  Pruebas de enfermedades de transmisin sexual (ETS). Las pruebas de Airline pilotdeteccin de ETS al comienzo del embarazo son Neomia Dearuna prctica de rutina, y en muchos estados es obligacin practicarlas.  Anlisis de anticuerpos. La examinarn para ver si es inmune a determinadas enfermedades, como la Stewartsvillerubola, que puede afectar al feto en desarrollo.  Deteccin de glucosa. Entre la semana 24y 28de embarazo, le analizarn el nivel de glucemia para detectar signos de diabetes gestacional. Pueden recomendarle un anlisis de seguimiento.  Estreptococos del grupoB. Es comn encontrar estas bacterias dentro de la vagina. Este Abbott Laboratoriesanlisis le indicar al mdico si necesita darle un antibitico para reducir la cantidad de este tipo de bacterias en el cuerpo antes del trabajo de parto y [place] Winnebagoel parto.  Ecografas. Alrededor de la semana 18a 20de embarazo, muchas embarazadas se hacen ecografas para evaluar la salud del feto y Engineer, manufacturingdetectar cualquier anomala en el desarrollo.  Prueba del VIH (virus de inmunodeficiencia humana). Al comienzo del Psychiatristembarazo, le harn una prueba de deteccin del VIH. Si corre un riesgo alto de Point Lookouttener VIH, pueden repetirle esta prueba durante el tercer trimestre del  embarazo. Pueden indicarle otro tipo de estudios segn su edad, sus antecedentes mdicos personales o familiares, u otros factores. CON QU FRECUENCIA DEBO VISITAR AL MDICO PARA LOS CUIDADOS PRENATALES? El programa de control correspondiente a los cuidados prenatales depender de cualquier enfermedad que usted tenga desde antes del embarazo o que haya desarrollado durante el mismo. Si usted no tiene Agricultural engineerninguna enfermedad preexistente, es probable que le hagan los siguientes controles:  Physiological scientistUna vez al mes durante los primeros 6meses de Bisonembarazo.  Dos veces al mes durante el sptimo y el octavo mes de Goshenembarazo.  Una vez a la Boston Scientificsemana en el noveno mes de Psychiatristembarazo y Cedar Hillhasta el parto. Si presenta signos de trabajo de parto prematuro u otros signos o sntomas preocupantes, es posible que deba ver al mdico con ms frecuencia. Consulte al HCA Incmdico sobre el programa de cuidados prenatales ms adecuado para su caso. QU PUEDO HACER PARA QUE EL BEB Y YO ESTEMOS TAN SANOS COMO SEA POSIBLE DURANTE EL EMBARAZO?  Tome una vitamina prenatal que contenga 400microgramos (0,4mg ) de cido flico CarMaxtodos los das. El mdico tambin puede indicarle que tome vitaminas adicionales, como yodo, vitaminaD, hierro, cobre y zinc.  Murfreesboroome de 1500 a 2000mg  de calcio CarMaxtodos los das desde la semana20 de Counsellorembarazo hasta el parto.  Asegrese de estar al da con las vacunas. A menos que el mdico le indique otra cosa: ? Debe aplicarse la vacuna contra la difteria, el ttanos y la tosferina (Tdap) entre la semana27 y 36de embarazo, independientemente de la fecha en la que recibi la ltima vacuna Tdap. Esta vacuna ayuda a proteger al beb contra la tosferina despus del nacimiento. ? Debe recibir una vacuna antigripal inactivada (IIV) anual como ayuda para protegerlos a usted y al beb de la gripe. Puede recibirla en cualquier momento del embarazo.  Siga una dieta bien equilibrada, que incluya lo siguiente: ? Nils PyleFrutas y verduras  frescas. ? Protenas magras. ? Alimentos con FedExalto contenido de calcio, Althacomo leche, Monettyogur, quesos duros y verduras de hojas color verde oscuro. ? Panes integrales.  No coma frutos de mar con alto contenido de mercurio, por ejemplo: ? Pez espada. ? [place]. ? [place]. ? [place]. ? Ms de Sabino Snipes6onzas de atn por semana.  No coma lo siguiente: ? Carnes o huevos crudos o mal cocidos. ? Alimentos no pasteurizados, como quesos blandos (brie, Kewauneeazul o feta), jugos y [place]. ? Embutidos. ? Salchichas que no se cocinaron en agua hirviendo.  Beba suficiente agua para mantener la orina clara o de color amarillo plido. Para muchas mujeres, la cantidad es de 10 o ms vasos de 8onzas de Regulatory affairs officeragua cada da. El hecho de mantenerse hidratada ayuda a que el feto reciba nutrientes y Network engineerpuede evitar el inicio de contracciones uterinas prematuras.  No consuma ningn producto que contenga tabaco, como cigarrillos, tabaco de Theatre managermascar o Administrator, Civil Servicecigarrillos electrnicos. Si necesita ayuda para dejar  de fumar, consulte al mdico.  No consuma bebidas que contengan alcohol. No se ha determinado que haya un nivel de consumo de alcohol que sea inocuo durante el [place].  No consuma drogas. Estas pueden daar al feto en desarrollo o causar un aborto espontneo.  Consulte al mdico o al farmacutico antes de tomar cualquier medicamento recetado o de venta libre, hierbas o suplementos.  Limite el consumo de cafena a no ms de 200mg  por da.  Haga actividad fsica. A menos que el mdico le indique otra cosa, intente hacer de ejercicio moderado la mayora de los [address] de la [place]. No practique actividades de alto impacto, deportes de contacto o actividades con alto riesgo de cadas, como equitacin o esqu extremo.  Descanse lo suficiente.  Evite todo aquello que aumente la temperatura corporal, como jacuzzis y saunas.  Si tiene un gato, no vace la bandeja sanitaria. Las bacterias presentes en las heces del gato pueden  causar una infeccin llamada toxoplasmosis. Esta puede daar gravemente al feto.  Aljese de las sustancias qumicas como insecticidas, plomo y [place], y de los productos de limpieza o pinturas que contengan solventes.  No se saque ninguna radiografa, excepto si es necesaria por razones mdicas.  Tome una clase de preparacin para el parto y Mining engineer. Pregntele al mdico si necesita una derivacin o una recomendacin.  Esta informacin no tiene Theme park manager el consejo del mdico. Asegrese de hacerle al mdico cualquier pregunta que tenga. Document Released: 04/20/2008 Document Revised: 02/24/2016 Document Reviewed: 01/17/2014 Elsevier Interactive Patient Education  2017 [employer].

## 2017-10-07 LAB — GLUCOSE, 1 HOUR GESTATIONAL: Gestational Diabetes Screen: 71 mg/dL (ref 65–139)

## 2017-10-07 LAB — RPR+RH+ABO+RUB AB+AB SCR+CB...
ANTIBODY SCREEN: NEGATIVE
HIV SCREEN 4TH GENERATION: NONREACTIVE
Hematocrit: 35 % (ref 34.0–46.6)
Hemoglobin: 11.4 g/dL (ref 11.1–15.9)
Hepatitis B Surface Ag: NEGATIVE
MCH: 28.8 pg (ref 26.6–33.0)
MCHC: 32.6 g/dL (ref 31.5–35.7)
MCV: 88 fL (ref 79–97)
PLATELETS: 276 10*3/uL (ref 150–379)
RBC: 3.96 x10E6/uL (ref 3.77–5.28)
RDW: 13.8 % (ref 12.3–15.4)
RPR: NONREACTIVE
RUBELLA: 28.5 {index} (ref >0.99)
Rh Factor: POSITIVE
VARICELLA: 1663 {index} (ref >165)
WBC: 10.3 10*3/uL (ref 3.4–10.8)

## 2017-10-23 ENCOUNTER — Other Ambulatory Visit: Payer: Self-pay | Admitting: Nurse Practitioner

## 2017-11-04 ENCOUNTER — Ambulatory Visit (INDEPENDENT_AMBULATORY_CARE_PROVIDER_SITE_OTHER): Payer: 59 | Admitting: Obstetrics and Gynecology

## 2017-11-04 ENCOUNTER — Ambulatory Visit (INDEPENDENT_AMBULATORY_CARE_PROVIDER_SITE_OTHER): Payer: 59

## 2017-11-04 ENCOUNTER — Other Ambulatory Visit: Payer: Self-pay | Admitting: Advanced Practice Midwife

## 2017-11-04 VITALS — BP 128/98 | Wt 140.0 lb

## 2017-11-04 DIAGNOSIS — O0990 Supervision of high risk pregnancy, unspecified, unspecified trimester: Secondary | ICD-10-CM | POA: Diagnosis not present

## 2017-11-04 DIAGNOSIS — Z362 Encounter for other antenatal screening follow-up: Secondary | ICD-10-CM

## 2017-11-04 DIAGNOSIS — O021 Missed abortion: Secondary | ICD-10-CM

## 2017-11-04 NOTE — Patient Instructions (Signed)
Somewhere between 10-20% of identified first trimester pregnancies will unfortunately end in miscarriage.  Given this relatively high incidence rate, further diagnostic testing such as chromosome analysis is generally not clinically relevant nor recommended.  Although the chromosomal abnormalities have been implicated at rates as high as 70% in some studies, these are generally random and do not infer and increased risk of recurrence with subsequent pregnancies.  However, 3 or more consecutive first trimester losses are relatively uncommon, and these patient generally do benefit from additional work up to determine a potential modifiable etiology.   We briefly discussed management options including 1) expectant management, 2) medical management, and 3) surgical management as well as their relative success rates and complications. Approximately 80% of first trimester miscarriages will pass successfully but may require a time frame of up to 8 weeks (ACOG Practice Bulletin 150 May 2015 "Early Pregnancy Loss").  Medical management using 800mcg of misoprostil administered every 3-hrs as needed for up to 3 doses speeds up the time frame to completion significantly, has literature supporting its use up to 63 days or 7411w0d gestation and results in a passage rate of 84-85% (ACOG Practice Bulletin 143 March 2014 "Medical Management of First-Trimester Abortion").  Dilation and curettage has the highest rate of uterine evacuation, but carries with is operative cost, surgical and anesthetic risk.  While these risk are relatively small they nevertheless include infection, bleeding, uterine perforation, formation of uterine synechia, and in rare cases death.   We discussed repeat ultrasound and or trending HCG levels if the patient wishes to pursue these prior to making her decision.  Clinically I am confident of the diagnosis, but I do not want any doubts in the patient's mind regarding the plan of management she chooses to  adopt.  I will allow the patient and her family to discuss management options and she was advised to contact the office to arrange final disposition one she has made her decision or should she have any follow up questions for myself.

## 2017-11-04 NOTE — Progress Notes (Signed)
Dating scan today.  

## 2017-11-05 ENCOUNTER — Telehealth: Payer: Self-pay

## 2017-11-05 NOTE — Telephone Encounter (Signed)
Pt states she had a miscarriage & wondering if [facility] can see her today to discuss treatment options.

## 2017-11-05 NOTE — Telephone Encounter (Signed)
Pt requesting a cb from [facility]. No other details given. [phone].

## 2017-11-05 NOTE — Progress Notes (Signed)
Obstetrics & Gynecology Office Visit   Chief Complaint:  Chief Complaint  Patient presents with  . ROB    dating scan    History of Present Illness: 31 year old G3P1011 at 6118w1d presenting for follow up first trimester screen and noted to have missed abortion with empty gestational sac no yolk sac or fetal pole although these were visualized on prior imaging.  She has not experienced any vaginal bleeding or cramping.  She is still experiencing fatigue, breast tenderness, and mild nausea as would be expected with an early pregnancy.  She does have history of prior ectopic pregnancy.   Review of Systems: Negative unless otherwise noted in HPI  Past Medical History:  Past Medical History:  Diagnosis Date  . Infertility associated with anovulation   . Ovarian cyst     Past Surgical History:  Past Surgical History:  Procedure Laterality Date  . CESAREAN SECTION  2011    Gynecologic History: Patient's last menstrual period was 07/30/2017.  Obstetric History: G3P1011  Family History:  Family History  Problem Relation Age of Onset  . Hypertension Father     Social History:  Social History   Socioeconomic History  . Marital status: Married    Spouse name: Not on file  . Number of children: Not on file  . Years of education: Not on file  . Highest education level: Not on file  Social Needs  . Financial resource strain: Not on file  . Food insecurity - worry: Not on file  . Food insecurity - inability: Not on file  . Transportation needs - medical: Not on file  . Transportation needs - non-medical: Not on file  Occupational History  . Not on file  Tobacco Use  . Smoking status: Never Smoker  . Smokeless tobacco: Never Used  Substance and Sexual Activity  . Alcohol use: No  . Drug use: No  . Sexual activity: Yes    Birth control/protection: None  Other Topics Concern  . Not on file  Social History Narrative  . Not on file    Allergies:  No Known  Allergies  Medications: Prior to Admission medications   Medication Sig Start Date End Date Taking? Authorizing Provider  IRON PO Take by mouth.    [provider]  medroxyPROGESTERone (PROVERA) 10 MG tablet Take 1 tablet (10 mg total) by mouth daily. Patient not taking: Reported on 09/27/2017 09/19/15   Hildred Laserherry, Anika, MD    Physical Exam Vitals:  Vitals:   11/04/17 1522  BP: (!) 128/98   Patient's last menstrual period was 07/30/2017.  General: NAD HEENT: normocephalic, anicteric Pulmonary: No increased work of breathing Neurologic: Grossly intact Psychiatric: mood appropriate, affect full  Female chaperone present for pelvic and breast  portions of the physical exam  Assessment: 31 y.o. G3P1011 with missed abortion/blighted ovum  Plan: Problem List Items Addressed This Visit    None    Visit Diagnoses    Missed abortion    -  Primary      Condolences were offered to the patient and her family.  I stressed that while emotionally difficult, that this did not occur because of an actions or inactions by the patient.  Somewhere between 10-20% of identified first trimester pregnancies will unfortunately end in miscarriage.  Given this relatively high incidence rate, further diagnostic testing such as chromosome analysis is generally not clinically relevant nor recommended.  Although the chromosomal abnormalities have been implicated at rates as high as  70% in some studies, these are generally random and do not infer and increased risk of recurrence with subsequent pregnancies.  However, 3 or more consecutive first trimester losses are relatively uncommon, and these patient generally do benefit from additional work up to determine a potential modifiable etiology.   We briefly discussed management options including expectant management, medical management, and surgical management as well as their relative success rates and complications. Approximately 80% of first trimester  miscarriages will pass successfully but may require a time frame of up to 8 weeks (ACOG Practice Bulletin 150 May 2015 "Early Pregnancy Loss").  Medical management using 800mcg of misoprostil administered every 3-hrs as needed for up to 3 doses speeds up the time frame to completion significantly, has literature supporting its use up to 63 days or 5053w0d gestation and results in a passage rate of 84-85% (ACOG Practice Bulletin 143 March 2014 "Medical Management of First-Trimester Abortion").  Dilation and curettage has the highest rate of uterine evacuation, but carries with is operative cost, surgical and anesthetic risk.  While these risk are relatively small they nevertheless include infection, bleeding, uterine perforation, formation of uterine synechia, and in rare cases death.   We discussed repeat ultrasound and or trending HCG levels if the patient wishes to pursue these prior to making her decision.  Clinically I am confident of the diagnosis, but I do not want any doubts in the patient's mind regarding the plan of management she chooses to adopt.  I will allow the patient and her family to discuss management options and she was advised to contact the office to arrange final disposition one she has made her decision or should she have any follow up questions for myself.    The patient is Rh  and rhogam is therefore not indicated.

## 2017-11-05 NOTE — Telephone Encounter (Signed)
Just saw this she'll have to see first available when we have an opening

## 2017-11-08 NOTE — Telephone Encounter (Signed)
Per pt AMS contacted her on Friday. She has an apt for 11/11/17.

## 2017-11-11 ENCOUNTER — Other Ambulatory Visit: Payer: Self-pay

## 2017-11-11 ENCOUNTER — Ambulatory Visit: Payer: 59 | Admitting: Obstetrics and Gynecology

## 2017-11-11 ENCOUNTER — Telehealth: Payer: Self-pay | Admitting: Obstetrics and Gynecology

## 2017-11-11 ENCOUNTER — Encounter
Admission: RE | Admit: 2017-11-11 | Discharge: 2017-11-11 | Disposition: A | Discharge status: Home / Self Care | Payer: 59 | Source: Ambulatory Visit | Attending: Obstetrics and Gynecology | Admitting: Obstetrics and Gynecology

## 2017-11-11 MED ORDER — DOXYCYCLINE HYCLATE 100 MG IV SOLR
200.0000 mg | INTRAVENOUS | Status: AC
  Administered 2017-11-12: 200 mg via INTRAVENOUS
  Filled 2017-11-11: qty 200

## 2017-11-11 NOTE — Telephone Encounter (Signed)
Pre admit is calling about her orders for Surgery tomorrow to be put into Epic please.

## 2017-11-11 NOTE — Patient Instructions (Signed)
  Your procedure is scheduled on: 11-12-17  Report to Same Day Surgery 2nd floor medical mall [facility](Medical Mall Entrance-take elevator on left to 2nd floor.  Check in with surgery information desk.) @ 3PM PER PT   Remember: Instructions that are not followed completely may result in serious medical risk, up to and including death, or upon the discretion of your surgeon and anesthesiologist your surgery may need to be rescheduled.    _x___ 1. Do not eat food after midnight the night before your procedure. NO GUM OR CANDY AFTER MIDNIGHT.  You may drink clear liquids up to 2 hours before you are scheduled to arrive at the hospital for your procedure.  Do not drink clear liquids within 2 hours of your scheduled arrival to the hospital.  Clear liquids include  --Water or Apple juice without pulp  --Clear carbohydrate beverage such as ClearFast or Gatorade  --Black Coffee or Clear Tea (No milk, no creamers, do not add anything to the coffee or Tea     __x__ 2. No Alcohol for 24 hours before or after surgery.   __x__3. No Smoking for 24 prior to surgery.   ____  4. Bring all medications with you on the day of surgery if instructed.    __x__ 5. Notify your doctor if there is any change in your medical condition     (cold, fever, infections).     Do not wear jewelry, make-up, hairpins, clips or nail polish.  Do not wear lotions, powders, or perfumes. You may wear deodorant.  Do not shave 48 hours prior to surgery. Men may shave face and neck.  Do not bring valuables to the hospital.    [facility] is not responsible for any belongings or valuables.               Contacts, dentures or bridgework may not be worn into surgery.  Leave your suitcase in the car. After surgery it may be brought to your room.  For patients admitted to the hospital, discharge time is determined by your  treatment team.   Patients discharged the day of surgery will not be allowed to drive home.  You will need someone to  drive you home and stay with you the night of your procedure.     ____ Take anti-hypertensive listed below, cardiac, seizure, asthma, anti-reflux and psychiatric medicines. These include:  1. NONE  2.  3.  4.  5.  6.  ____Fleets enema or Magnesium Citrate as directed.   ____ Use CHG Soap or sage wipes as directed on instruction sheet   ____ Use inhalers on the day of surgery and bring to hospital day of surgery  ____ Stop Metformin and Janumet 2 days prior to surgery.    ____ Take 1/2 of usual insulin dose the night before surgery and none on the morning surgery.   ____ Follow recommendations from Cardiologist, Pulmonologist or PCP regarding stopping Aspirin, Coumadin, Plavix ,Eliquis, Effient, or Pradaxa, and Pletal.  X____Stop Anti-inflammatories such as Advil, Aleve, IBUPROFEN, Motrin, Naproxen, Naprosyn, Goodies powders or aspirin products NOW-OK to take Tylenol    ____ Stop supplements until after surgery.     ____ Bring C-Pap to the hospital.

## 2017-11-12 ENCOUNTER — Ambulatory Visit
Admission: RE | Admit: 2017-11-12 | Discharge: 2017-11-12 | Disposition: A | Discharge status: Home / Self Care | Payer: 59 | Source: Ambulatory Visit | Attending: Obstetrics and Gynecology | Admitting: Obstetrics and Gynecology

## 2017-11-12 ENCOUNTER — Encounter: Payer: Self-pay | Admitting: *Deleted

## 2017-11-12 ENCOUNTER — Ambulatory Visit: Payer: 59 | Admitting: Anesthesiology

## 2017-11-12 ENCOUNTER — Encounter
Admission: RE | Disposition: A | Discharge status: Home / Self Care | Payer: Self-pay | Source: Ambulatory Visit | Attending: Obstetrics and Gynecology

## 2017-11-12 DIAGNOSIS — O021 Missed abortion: Secondary | ICD-10-CM | POA: Insufficient documentation

## 2017-11-12 DIAGNOSIS — Z9889 Other specified postprocedural states: Secondary | ICD-10-CM

## 2017-11-12 DIAGNOSIS — Z8249 Family history of ischemic heart disease and other diseases of the circulatory system: Secondary | ICD-10-CM | POA: Diagnosis not present

## 2017-11-12 DIAGNOSIS — O0889 Other complications following an ectopic and molar pregnancy: Secondary | ICD-10-CM

## 2017-11-12 DIAGNOSIS — Z3A12 12 weeks gestation of pregnancy: Secondary | ICD-10-CM | POA: Diagnosis not present

## 2017-11-12 DIAGNOSIS — O3481 Maternal care for other abnormalities of pelvic organs, first trimester: Secondary | ICD-10-CM | POA: Diagnosis not present

## 2017-11-12 HISTORY — PX: DILATION AND EVACUATION: SHX1459

## 2017-11-12 LAB — TYPE AND SCREEN
ABO/RH(D): A POS
Antibody Screen: NEGATIVE

## 2017-11-12 SURGERY — DILATION AND EVACUATION, UTERUS
Anesthesia: General | Site: Cervix | Wound class: Clean Contaminated

## 2017-11-12 MED ORDER — FENTANYL CITRATE (PF) 100 MCG/2ML IJ SOLN
INTRAMUSCULAR | Status: DC | PRN
  Administered 2017-11-12 (×4): 25 ug via INTRAVENOUS

## 2017-11-12 MED ORDER — LIDOCAINE HCL (CARDIAC) 20 MG/ML IV SOLN
INTRAVENOUS | Status: DC | PRN
  Administered 2017-11-12: 50 mg via INTRAVENOUS

## 2017-11-12 MED ORDER — ONDANSETRON HCL 4 MG/2ML IJ SOLN
INTRAMUSCULAR | Status: AC
  Filled 2017-11-12: qty 2

## 2017-11-12 MED ORDER — KETOROLAC TROMETHAMINE 30 MG/ML IJ SOLN
INTRAMUSCULAR | Status: AC
  Filled 2017-11-12: qty 1

## 2017-11-12 MED ORDER — DEXAMETHASONE SODIUM PHOSPHATE 10 MG/ML IJ SOLN
INTRAMUSCULAR | Status: DC | PRN
  Administered 2017-11-12: 10 mg via INTRAVENOUS

## 2017-11-12 MED ORDER — MIDAZOLAM HCL 2 MG/2ML IJ SOLN
INTRAMUSCULAR | Status: AC
  Filled 2017-11-12: qty 2

## 2017-11-12 MED ORDER — OXYCODONE HCL 5 MG/5ML PO SOLN: 5.0000 mg | Freq: Once | ORAL | Status: DC | PRN

## 2017-11-12 MED ORDER — IBUPROFEN 600 MG PO TABS: 600.0000 mg | ORAL_TABLET | Freq: Four times a day (QID) | ORAL | 0 refills | Status: DC | PRN

## 2017-11-12 MED ORDER — HYDROCODONE-ACETAMINOPHEN 5-325 MG PO TABS: 1.0000 | ORAL_TABLET | ORAL | 0 refills | Status: DC | PRN

## 2017-11-12 MED ORDER — PHENYLEPHRINE HCL 10 MG/ML IJ SOLN
INTRAMUSCULAR | Status: DC | PRN
  Administered 2017-11-12 (×2): 100 ug via INTRAVENOUS

## 2017-11-12 MED ORDER — ONDANSETRON HCL 4 MG/2ML IJ SOLN
INTRAMUSCULAR | Status: DC | PRN
  Administered 2017-11-12: 4 mg via INTRAVENOUS

## 2017-11-12 MED ORDER — PROPOFOL 10 MG/ML IV BOLUS
INTRAVENOUS | Status: AC
  Filled 2017-11-12: qty 20

## 2017-11-12 MED ORDER — KETOROLAC TROMETHAMINE 30 MG/ML IJ SOLN
INTRAMUSCULAR | Status: DC | PRN
  Administered 2017-11-12: 30 mg via INTRAVENOUS

## 2017-11-12 MED ORDER — OXYCODONE HCL 5 MG PO TABS: 5.0000 mg | ORAL_TABLET | Freq: Once | ORAL | Status: DC | PRN

## 2017-11-12 MED ORDER — PROPOFOL 10 MG/ML IV BOLUS
INTRAVENOUS | Status: DC | PRN
  Administered 2017-11-12: 150 mg via INTRAVENOUS

## 2017-11-12 MED ORDER — FENTANYL CITRATE (PF) 100 MCG/2ML IJ SOLN: 25.0000 ug | INTRAMUSCULAR | Status: DC | PRN

## 2017-11-12 MED ORDER — FAMOTIDINE 20 MG PO TABS
ORAL_TABLET | ORAL | Status: AC
  Filled 2017-11-12: qty 1

## 2017-11-12 MED ORDER — FAMOTIDINE 20 MG PO TABS
20.0000 mg | ORAL_TABLET | Freq: Once | ORAL | Status: AC
  Administered 2017-11-12: 20 mg via ORAL

## 2017-11-12 MED ORDER — DEXAMETHASONE SODIUM PHOSPHATE 10 MG/ML IJ SOLN
INTRAMUSCULAR | Status: AC
  Filled 2017-11-12: qty 1

## 2017-11-12 MED ORDER — MIDAZOLAM HCL 2 MG/2ML IJ SOLN
INTRAMUSCULAR | Status: DC | PRN
  Administered 2017-11-12: 2 mg via INTRAVENOUS

## 2017-11-12 MED ORDER — FENTANYL CITRATE (PF) 100 MCG/2ML IJ SOLN
INTRAMUSCULAR | Status: AC
  Filled 2017-11-12: qty 2

## 2017-11-12 MED ORDER — LACTATED RINGERS IV SOLN
INTRAVENOUS | Status: DC
  Administered 2017-11-12: 15:00:00 via INTRAVENOUS

## 2017-11-12 MED ORDER — ONDANSETRON HCL 4 MG/2ML IJ SOLN
4.0000 mg | Freq: Once | INTRAMUSCULAR | Status: AC
  Administered 2017-11-12: 4 mg via INTRAVENOUS

## 2017-11-12 SURGICAL SUPPLY — 18 items
CATH ROBINSON RED A/P 16FR (CATHETERS) ×3 IMPLANT
FILTER UTR ASPR SPEC (MISCELLANEOUS) ×1 IMPLANT
FLTR UTR ASPR SPEC (MISCELLANEOUS) ×3
GLOVE BIO SURGEON STRL SZ7 (GLOVE) ×3 IMPLANT
GOWN STRL REUS W/ TWL LRG LVL3 (GOWN DISPOSABLE) ×2 IMPLANT
GOWN STRL REUS W/TWL LRG LVL3 (GOWN DISPOSABLE) ×4
KIT BERKELEY 1ST TRIMESTER 3/8 (MISCELLANEOUS) ×3 IMPLANT
KIT RM TURNOVER CYSTO AR (KITS) ×3 IMPLANT
NS IRRIG 500ML POUR BTL (IV SOLUTION) ×3 IMPLANT
PACK DNC HYST (MISCELLANEOUS) ×3 IMPLANT
PAD OB MATERNITY 4.3X12.25 (PERSONAL CARE ITEMS) ×3 IMPLANT
PAD PREP 24X41 OB/GYN DISP (PERSONAL CARE ITEMS) ×3 IMPLANT
SET BERKELEY SUCTION TUBING (SUCTIONS) ×3 IMPLANT
TOWEL OR 17X26 4PK STRL BLUE (TOWEL DISPOSABLE) ×3 IMPLANT
VACURETTE 10 RIGID CVD (CANNULA) IMPLANT
VACURETTE 12 RIGID CVD (CANNULA) IMPLANT
VACURETTE 8 RIGID CVD (CANNULA) IMPLANT
VACURETTE 8MM F TIP (MISCELLANEOUS) ×3 IMPLANT

## 2017-11-12 NOTE — Anesthesia Preprocedure Evaluation (Signed)
Anesthesia Evaluation  Patient identified by MRN, date of birth, ID band Patient awake    Reviewed: Allergy & Precautions, H&P , NPO status , Patient's Chart, lab work & pertinent test results  History of Anesthesia Complications Negative for: history of anesthetic complications  Airway Mallampati: II  TM Distance: >3 FB Neck ROM: full    Dental  (+) Chipped   Pulmonary neg pulmonary ROS, neg shortness of breath,           Cardiovascular Exercise Tolerance: Good (-) angina(-) Past MI and (-) DOE negative cardio ROS       Neuro/Psych negative neurological ROS  negative psych ROS   GI/Hepatic negative GI ROS, Neg liver ROS, neg GERD  ,  Endo/Other  negative endocrine ROS  Renal/GU      Musculoskeletal   Abdominal   Peds  Hematology negative hematology ROS (+)   Anesthesia Other Findings Missed AB  Past Medical History: No date: Infertility associated with anovulation No date: Ovarian cyst  Past Surgical History: 2011: CESAREAN SECTION     Reproductive/Obstetrics negative OB ROS                             Anesthesia Physical Anesthesia Plan  ASA: II  Anesthesia Plan: General   Post-op Pain Management:    Induction: Intravenous  PONV Risk Score and Plan: 4 or greater and Ondansetron, Dexamethasone and Midazolam  Airway Management Planned: LMA  Additional Equipment:   Intra-op Plan:   Post-operative Plan: Extubation in OR  Informed Consent: I have reviewed the patients History and Physical, chart, labs and discussed the procedure including the risks, benefits and alternatives for the proposed anesthesia with the patient or authorized representative who has indicated his/her understanding and acceptance.   Dental Advisory Given  Plan Discussed with: Anesthesiologist, CRNA and Surgeon  Anesthesia Plan Comments: (Patient consented for risks of anesthesia including  but not limited to:  - adverse reactions to medications - damage to teeth, lips or other oral mucosa - sore throat or hoarseness - Damage to heart, brain, lungs or loss of life  Patient voiced understanding.)        Anesthesia Quick Evaluation

## 2017-11-12 NOTE — Anesthesia Procedure Notes (Signed)
Procedure Name: LMA Insertion Date/Time: 11/12/2017 5:06 PM Performed by: Omer JackWeatherly, Shontez Sermon, CRNA Pre-anesthesia Checklist: Patient identified, Patient being monitored, Timeout performed, Emergency Drugs available and Suction available Patient Re-evaluated:Patient Re-evaluated prior to induction Oxygen Delivery Method: Circle system utilized Preoxygenation: Pre-oxygenation with 100% oxygen Induction Type: IV induction Ventilation: Mask ventilation without difficulty LMA: LMA inserted LMA Size: 3.5 Tube type: Oral Number of attempts: 1 Placement Confirmation: positive ETCO2 and breath sounds checked- equal and bilateral Tube secured with: Tape Dental Injury: Teeth and Oropharynx as per pre-operative assessment

## 2017-11-12 NOTE — OR Nursing (Signed)
Lab in for type and screen 1501

## 2017-11-12 NOTE — Anesthesia Post-op Follow-up Note (Signed)
Anesthesia QCDR form completed.        

## 2017-11-12 NOTE — H&P (Signed)
Obstetrics & Gynecology Surgery H&P    Chief Complaint: Scheduled Surgery   History of Present Illness: Patient is a 31 y.o. G3P1011 presenting for scheduled suction D&C, for the treatment or further evaluation of missed abortion/blighted ovum, with early ultrasound demonstrating early IUP and FHT follow up ultrasound at 12 weeks no evidence of fetal pole or yolk sac. No vaginal bleeding or cramping.  Rh positive rhogam not indicated.  Prior Treatments prior to proceeding with surgery include: expectant, medical, and surgical options discussed with patient   Review of Systems:10 point review of systems  Past Medical History:  Past Medical History:  Diagnosis Date  . Infertility associated with anovulation   . Ovarian cyst     Past Surgical History:  Past Surgical History:  Procedure Laterality Date  . CESAREAN SECTION  2011    Family History:  Family History  Problem Relation Age of Onset  . Hypertension Father     Social History:  Social History   Socioeconomic History  . Marital status: Married    Spouse name: Not on file  . Number of children: Not on file  . Years of education: Not on file  . Highest education level: Not on file  Social Needs  . Financial resource strain: Not on file  . Food insecurity - worry: Not on file  . Food insecurity - inability: Not on file  . Transportation needs - medical: Not on file  . Transportation needs - non-medical: Not on file  Occupational History  . Not on file  Tobacco Use  . Smoking status: Never Smoker  . Smokeless tobacco: Never Used  Substance and Sexual Activity  . Alcohol use: No  . Drug use: No  . Sexual activity: Yes    Birth control/protection: None  Other Topics Concern  . Not on file  Social History Narrative  . Not on file    Allergies:  No Known Allergies  Medications: Prior to Admission medications   Medication Sig Start Date End Date Taking? Authorizing Provider  ibuprofen (ADVIL,MOTRIN)  200 MG tablet Take 200 mg by mouth every 6 (six) hours as needed for mild pain.   Yes [provider]    Physical Exam Vitals: Blood pressure 123/82, pulse 88, temperature (!) 97.4 F (36.3 C), temperature source Tympanic, resp. rate 16, height 4\' 11"  (1.499 m), weight 140 lb (63.5 kg), last menstrual period 07/30/2017, SpO2 100 %. General: NAD HEENT: normocephalic, anicteric Pulmonary: CTAB, no increased work of breathing Cardiovascular: RRR, distal pulses 2+ Abdomen: soft, non-tender Genitourinary: deferred to OR Extremities: no edema, erythema, or tenderness Neurologic: Grossly intact Psychiatric: mood appropriate, affect full  Imaging No results found.  Assessment: 31 y.o. G3P1011 presenting for scheduled suction D&C  Plan: 1)I have discussed with the patient the indications for the procedure. Included in the discussion were the options of therapy, as wall as their individual risks, benefits, and complications. Ample time was given to answer all questions.    After consideration of her history and findings, mutual decision has been made to proceed with D+C. While the incidence is low, the risks from this surgery include, but are not limited to, the risks of anesthesia, hemorrhage, infection, perforation, and injury to adjacent structures including bowel, bladder and blood vessels.  - Doxycyline 200mg  IV preop - SCD's  2) Routine postoperative instructions were reviewed with the patient and her family in detail today including the expected length of recovery and likely postoperative course.  The patient concurred with  the proposed plan, giving informed written consent for the surgery today.  Patient instructed on the importance of being NPO after midnight prior to her procedure.  If warranted preoperative prophylactic antibiotics and SCDs ordered on call to the OR to meet SCIP guidelines and adhere to recommendation laid forth in ACOG Practice Bulletin Number 104 May 2009   "Antibiotic Prophylaxis for Gynecologic Procedures".

## 2017-11-12 NOTE — Progress Notes (Signed)
No vomiting   States nausea is better 

## 2017-11-12 NOTE — Transfer of Care (Signed)
Immediate Anesthesia Transfer of Care Note  Patient: Destiny Allen  Procedure(s) Performed: DILATATION AND EVACUATION (N/A Cervix)  Patient Location: PACU  Anesthesia Type:General  Level of Consciousness: drowsy and patient cooperative  Airway & Oxygen Therapy: Patient Spontanous Breathing and Patient connected to face mask oxygen  Post-op Assessment: Report given to RN and Post -op Vital signs reviewed and stable  Post vital signs: Reviewed and stable  Last Vitals:  Vitals:   11/12/17 1449 11/12/17 1738  BP: 123/82 107/77  Pulse: 88 97  Resp: 16 15  Temp: (!) 36.3 C 36.9 C  SpO2: 100% 100%    Last Pain:  Vitals:   11/12/17 1738  TempSrc:   PainSc: (P) Asleep         Complications: No apparent anesthesia complications

## 2017-11-12 NOTE — Anesthesia Postprocedure Evaluation (Signed)
Anesthesia Post Note  Patient: Destiny Allen  Procedure(s) Performed: DILATATION AND EVACUATION (N/A Cervix)  Patient location during evaluation: PACU Anesthesia Type: General Level of consciousness: awake and alert Pain management: pain level controlled Vital Signs Assessment: post-procedure vital signs reviewed and stable Respiratory status: spontaneous breathing, nonlabored ventilation, respiratory function stable and patient connected to nasal cannula oxygen Cardiovascular status: blood pressure returned to baseline and stable Postop Assessment: no apparent nausea or vomiting Anesthetic complications: no     Last Vitals:  Vitals:   11/12/17 1824 11/12/17 1857  BP: 108/72 102/72  Pulse: 98 84  Resp: 16   Temp: 37 C   SpO2: 100% 100%    Last Pain:  Vitals:   11/12/17 1824  TempSrc: Temporal  PainSc:                  Destiny Allen

## 2017-11-12 NOTE — Op Note (Addendum)
Patient Name: Destiny Allen Date of Procedure: 11/12/17  Preoperative Diagnosis: 1) 31 y.o. with missed abortion   Postoperative Diagnosis: 1) 31 y.o. with missed abortion  Operation Performed: Suction dilation and curettage  Indication: Missed abortion  Anesthesia: General  Primary Surgeon: Vena AustriaAndreas Maurya Nethery, MD  Assistant: none  Preoperative Antibiotics: none  Estimated Blood Loss: 150 mL  IV Fluids: 300mL  Urine Output:: ~4020mL straight cath  Drains or Tubes: none  Implants: none  Specimens Removed: Products of conception  Complications: none  Intraoperative Findings:  Uterus enlarged, anteverted, 10 week size.  Cervix closed.  A large amount of placental tissues was evacuated from the uterine cavity, greater than would be expected for gestational age and concerning for possible molar pregnancy.  Patient Condition: stable  Procedure in Detail:  Patient was taken to the operating room were she was administered general endotracheal anesthesia.  She was positioned in the dorsal lithotomy position utilizing Allen stirups, prepped and draped in the usual sterile fashion.  Uterus was noted to be enlarged in size approximately 10 weeks, anteverted.   Prior to proceeding with the case a time out was performed.    Attention was turned to the patient's pelvis.  A red rubber catheter was used to empty the patient's bladder.  An operative speculum was placed to allow visualization of the cervix.  The anterior lip of the cervix was grasped with a single tooth tenaculum and the cervix was sequentially dilated using pratt dilators.  A size 8 felxible flexible/rigid suction curette was then advanced to the uterine fundus.  Several passes were undertaken to clear the uterine contents.  Sharp curettage was then  performed nothing good uterine cry throughout the cavity.  A final pass of the suction curette was then undertaken.  The resulting specimen was sent to pathology.    The single  tooth tenaculum was removed from the cervix.  The tenaculum sites and cervix were noted to be  Hemostatic before removing the operative speculum.  Sponge needle and instrument counts were corrects times two.  The patient tolerated the procedure well and was taken to the recovery room in stable condition.

## 2017-11-13 ENCOUNTER — Encounter: Payer: Self-pay | Admitting: Obstetrics and Gynecology

## 2017-11-17 ENCOUNTER — Ambulatory Visit (INDEPENDENT_AMBULATORY_CARE_PROVIDER_SITE_OTHER): Payer: 59 | Admitting: Obstetrics and Gynecology

## 2017-11-17 ENCOUNTER — Encounter: Payer: Self-pay | Admitting: Obstetrics and Gynecology

## 2017-11-17 VITALS — BP 114/78 | HR 80 | Wt 138.0 lb

## 2017-11-17 DIAGNOSIS — O0889 Other complications following an ectopic and molar pregnancy: Secondary | ICD-10-CM

## 2017-11-17 DIAGNOSIS — Z4889 Encounter for other specified surgical aftercare: Secondary | ICD-10-CM

## 2017-11-17 NOTE — Patient Instructions (Signed)
Embarazo molar °(Molar Pregnancy) °Un embarazo molar (mola hidatiforme) es una masa de tejido que crece en el útero después de una concepción. La masa está formada por un óvulo que no fue fertilizado correctamente y crece de forma anormal. Es un embarazo anormal y no llega a ser un feto. Si el médico sospecha la existencia de un embarazo molar, se necesitará un tratamiento. °CAUSAS °El embarazo molar se da cuando un óvulo se fecunda incorrectamente, de modo que tiene material genético anormal (cromosomas). Esto puede resultar en uno de 2 tipos de embarazo molar: °· Embarazo molar completo: todos los cromosomas en el óvulo fecundado provienen del padre; ninguno proviene de la madre. °· Embarazo molar parcial: el óvulo fecundado tiene cromosomas del padre y de la madre, pero estos son demasiados. °FACTORES DE RIESGO °Ciertos factores de riesgo aumentan la probabilidad de que ocurra un embarazo molar. Ellos son: °· Ser mayor de 35 o menor de 20 años. °· Historia de un embarazo molar en el pasado (muy escasa probabilidad de recurrencia). °Otros factores de riesgo posibles son: °· Fumar más de 15 cigarrillos por día. °· Historia de infertilidad. °· Tener cierto tipo de sangre (A, B, AB). °· Tener déficit de vitamina A. °· El uso de anticonceptivos orales. °SIGNOS Y SÍNTOMAS °· Hemorragia vaginal. °· Falta del periodo menstrual. °· El útero crece más rápido de lo normal. °· Náuseas y vómitos intensos. °· Presión o dolor fuerte en el útero. °· Quistes ováricos anormales (quistes tecaluteínicos). °· Secreción vaginal similar a uvas. °· Presión arterial alta (inicio temprano de preeclampsia). °· Hiperactividad tiroidea (hipertiroidismo). °· Anemia. ° °DIAGNÓSTICO °Si el médico cree que existe la posibilidad de un embarazo molar, le indicará algunos estudios. Estos posibles estudios incluyen los siguientes: °· Una ecografía. °· Análisis de sangre. °TRATAMIENTO °La mayoría de los embarazos molares finalizan de manera  espontánea en un aborto. Sin embargo, el médico tiene que asegurarse de que no haya quedado tejido anormal dentro del útero. Esto se puede hacer mediante dilatación y curetaje (D y C) o legrado por aspiración. En este procedimiento, se extirpa todo el tejido molar restante a través de la vagina. °Después del diagnóstico de un embarazo molar, deben controlarse los niveles de hormona del embarazo hasta que el nivel sea cero. Si el nivel de hormona del embarazo no cae apropiadamente, será necesario realizar un tratamiento de quimioterapia. Además, también recibirá un medicamento llamado inmunoglobulina Rho (D) si su tipo de sangre es Rh negativo y el de su pareja sexual es Rh positivo. Esto ayuda a prevenir problemas con el factor Rh en próximos embarazos. °INSTRUCCIONES PARA EL CUIDADO EN EL HOGAR °· Evite quedar embarazada durante 6 a 12 meses o según las indicaciones del médico. Use un método anticonceptivo confiable o no tenga relaciones sexuales. °· Tome sólo medicamentos de venta libre o recetados, según las indicaciones del médico. °· Asista a todas las visitas de seguimiento y realícese todos los análisis de laboratorio y las ecografías sugeridas. °· Vuelva poco a poco a sus actividades normales. °· Considere participar en un grupo de apoyo. Pida ayuda si usted tiene dificultad para elaborar el duelo. ° °Esta información no tiene como fin reemplazar el consejo del médico. Asegúrese de hacerle al médico cualquier pregunta que tenga. °Document Released: 10/22/2011 Document Revised: 08/23/2013 Document Reviewed: 06/01/2013 °Elsevier Interactive Patient Education © 2017 [employer] °Molar Pregnancy °A molar pregnancy (hydatidiform mole) is a mass of tissue that grows in the uterus after conception. The mass is created by an egg that was not   fertilized correctly and abnormally grows. It is an abnormal pregnancy and does not develop into a fetus. If a molar pregnancy is suspected by your health care provider,  treatment is required. °What are the causes? °Molar pregnancy is caused by an egg that is fertilized incorrectly so that it has abnormal genetic material (chromosomes). This can result in one of 2 types of molar pregnancy: °· Complete molar pregnancy--All of the chromosomes in the fertilized egg come from the father; none come from the mother. °· Partial molar pregnancy--The fertilized egg has chromosomes from the father and mother, but it has too many chromosomes. ° °What increases the risk? °Certain risk factors make a molar pregnancy more likely. They include: °· Being over age 35 or under age 20. °· History of a molar pregnancy in the past (extremely small chance of recurrence). ° °Other possible risk factors include: °· Smoking more than 15 cigarettes per day. °· History of infertility. °· Having a certain blood type (A, B, AB). °· Having a vitamin A deficiency. °· Using oral contraceptives. ° °What are the signs or symptoms? °· Vaginal bleeding. °· Missed menstrual period. °· Uterus grows quicker than normal. °· Severe nausea and vomiting. °· Severe pressure or pain in the uterus. °· Abnormal ovarian cysts (theca lutein cysts). °· Discharge from the vagina that looks like grapes. °· High blood pressure (early onset of preeclampsia). °· Overactive thyroid (hyperthyroidism). °· Anemia. °How is this diagnosed? °If your health care provider thinks there is a chance of a molar pregnancy, testing will be recommended. Possible tests include: °· An ultrasound test. °· Blood tests. ° °How is this treated? °Most molar pregnancies end on their own by miscarriage. However, a health care provider needs to make sure that all the abnormal tissue is out of the womb. This can be done with dilation and curettage (D&C) or suction curettage. In this procedure, any remaining molar tissue is removed through the vagina. °After diagnosis of a molar pregnancy, the pregnancy hormone levels must be followed until the level is zero. If  the pregnancy hormone level does not drop appropriately, chemotherapy may be necessary. Also, you will be given a medicine called Rho (D) immune globulin if you are Rh negative and your sex partner is Rh positive. This helps prevent Rh problems in future pregnancies. °Follow these instructions at home: °· Avoid getting pregnant for 6-12 months or as directed by your health care provider. Use a reliable form of birth control or do not have sex. °· Only take over-the-counter or prescription medicine as directed by your health care provider. °· Keep all follow-up appointments and get all suggested lab tests and ultrasound tests. °· Gradually return to normal activities. °· Think about joining a support group. Ask for help if you are struggling with grief. °This information is not intended to replace advice given to you by your health care provider. Make sure you discuss any questions you have with your health care provider. °Document Released: 07/21/2011 Document Revised: 04/09/2016 Document Reviewed: 06/01/2013 °Elsevier Interactive Patient Education © 2017 [employer] Inc[employer] ° °

## 2017-11-18 LAB — BETA HCG QUANT (REF LAB): HCG QUANT: 979 m[IU]/mL

## 2017-11-18 NOTE — Progress Notes (Signed)
      Postoperative Follow-up Patient presents post op from suction D&C 1weeks ago for missed abortion.  Subjective: Patient reports marked improvement in her preop symptoms. Eating a regular diet with difficulty. The patient is not having any pain.  Activity: normal activities of daily living. Still having some intermittent spotting.  Pathology consistent with partial molar pregnancy.  Objective: Vitals:   11/17/17 1334  BP: 114/78  Pulse: 80   Gen: NAD HEENT: normocephalic, anicteric Pulmonary: no increased work of breathing Ext: no edema Psych: affect full, mood appropriate  Pathology A. UTERINE CONTENTS; DILATATION AND EVACUATION:  - PRODUCTS OF CONCEPTION WITH ABNORMAL VILLOUS MORPHOLOGY, SEE COMMENT.   Comment:  Enlarged, hydropic, avascular villi are present, admixed with smaller  villi. There are occasional cisterns and trophoblastic inclusions. No  polar trophoblastic proliferation is seen, and there is only focal  minimal circumferential trophoblastic hyperplasia.   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 Molecular genetic analysis by PCR of 15 short tandem  repeat (STR) markers and a gender marker (amelogenin) has been ordered.  The result will be reported in an addendum  Assessment: 32 y.o. 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  I have discussed the post-operative course to date, and the expected progress moving forward.  The patient understands what complications to be concerned about.  I will see the patient in routine follow up, or sooner if needed.    Activity plan: No restriction. - Discussed need for contraception and regular HCG follow up in the next few months - Patient opts for condoms - Repeat HCG obtained today  Vena AustriaAndreas Anayansi Rundquist 11/18/2017, 12:08 PM

## 2017-11-29 LAB — SURGICAL PATHOLOGY

## 2017-11-30 ENCOUNTER — Encounter: Payer: Self-pay | Admitting: Obstetrics and Gynecology

## 2017-12-16 ENCOUNTER — Ambulatory Visit (INDEPENDENT_AMBULATORY_CARE_PROVIDER_SITE_OTHER): Payer: 59 | Admitting: Obstetrics and Gynecology

## 2017-12-16 ENCOUNTER — Encounter: Payer: Self-pay | Admitting: Obstetrics and Gynecology

## 2017-12-16 VITALS — BP 106/68 | HR 100 | Ht 59.0 in | Wt 139.0 lb

## 2017-12-16 DIAGNOSIS — Z4889 Encounter for other specified surgical aftercare: Secondary | ICD-10-CM

## 2017-12-16 DIAGNOSIS — O0889 Other complications following an ectopic and molar pregnancy: Secondary | ICD-10-CM

## 2017-12-16 NOTE — Progress Notes (Signed)
      Postoperative Follow-up Patient presents post op from suction D&C 6 weeks ago for missed abortion (partial molar on final pathology).  Subjective: Patient reports marked improvement in her preop symptoms. Eating a regular diet without difficulty. The patient is not having any pain.  Activity: normal activities of daily living.  Objective: Blood pressure 106/68, pulse 100, height 4\' 11"  (1.499 m), weight 139 lb (63 kg), unknown if currently breastfeeding.  Gen: NAD HEENT: normocephalic, anicteric Pulmonary: no increased work of breathing Genitourinary:  External: Normal external female genitalia.  Normal urethral meatus, normal Bartholin's and Skene's glands.    Vagina: Normal vaginal mucosa, no evidence of prolapse.    Cervix: Grossly normal in appearance, no bleeding  Uterus: Non-enlarged, mobile, normal contour.  No CMT  Adnexa: ovaries non-enlarged, no adnexal masses  Rectal: deferred  Lymphatic: no evidence of inguinal lymphadenopathy  Pathology  Surgical Pathology  * THIS IS AN ADDENDUM REPORT  CASE: [id number]  PATIENT: Destiny Allen  Surgical Pathology Report   Addendum  Reason for Addendum #1: Additional clinical/test information   SPECIMEN SUBMITTED:  A. Products of conception   CLINICAL HISTORY:  U/S exam on 11/04/17 showed empty gestational sac, no yolk sac or fetal  pole although these were seen on U/S exam on 11/21. [facility] size >25 mm, no embryo or yolk sac, and  beta HCG 64,070.   PRE-OPERATIVE DIAGNOSIS:  Missed AB at [id number]   POST-OPERATIVE DIAGNOSIS:  Large amount of placental tissue evacuated from the uterine cavity,  greater than would be expected for gestational age and concerning for  possible molar pregnancy.      DIAGNOSIS:  A. UTERINE CONTENTS; DILATATION AND EVACUATION:  - PRODUCTS OF CONCEPTION WITH ABNORMAL VILLOUS MORPHOLOGY, SEE COMMENT.   Comment:  Enlarged, hydropic,  avascular villi are present, admixed with smaller  villi. There are occasional cisterns and trophoblastic inclusions. No  polar trophoblastic proliferation is seen, and there is only focal  minimal circumferential trophoblastic hyperplasia.   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 Molecular genetic analysis by PCR of 15 short tandem  repeat (STR) markers and a gender marker (amelogenin) has been ordered.  The result will be reported in an addendum.     DNA testing by PCR for molar pregnancy:  Result: POSITIVE for partial hydatidiform mole, see comment:   Comment:  The placental tissue demonstrates paternally derived triploidy  consistent with a partial mole. 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 Clinical follow-up is indicated.   Assessment: 32 y.o. s/p suction D&C stable  Plan: Patient has done well after surgery with no apparent complications.  I have discussed the post-operative course to date, and the expected progress moving forward.  The patient understands what complications to be concerned about.  I will see the patient in routine follow up, or sooner if needed.    Activity plan: No restriction. - repeat HCG today - trend monthly over the next months - discussed the importance of avoiding pregnancy until cleared  Vena AustriaAndreas Linton Stolp 12/16/2017, 1:09 PM

## 2017-12-16 NOTE — Patient Instructions (Addendum)
Embarazo molar °(Molar Pregnancy) °Un embarazo molar (mola hidatiforme) es una masa de tejido que crece en el útero después de una concepción. La masa está formada por un óvulo que no fue fertilizado correctamente y crece de forma anormal. Es un embarazo anormal y no llega a ser un feto. Si el médico sospecha la existencia de un embarazo molar, se necesitará un tratamiento. °CAUSAS °El embarazo molar se da cuando un óvulo se fecunda incorrectamente, de modo que tiene material genético anormal (cromosomas). Esto puede resultar en uno de 2 tipos de embarazo molar: °· Embarazo molar completo: todos los cromosomas en el óvulo fecundado provienen del padre; ninguno proviene de la madre. °· Embarazo molar parcial: el óvulo fecundado tiene cromosomas del padre y de la madre, pero estos son demasiados. °FACTORES DE RIESGO °Ciertos factores de riesgo aumentan la probabilidad de que ocurra un embarazo molar. Ellos son: °· Ser mayor de 35 o menor de 20 años. °· Historia de un embarazo molar en el pasado (muy escasa probabilidad de recurrencia). °Otros factores de riesgo posibles son: °· Fumar más de 15 cigarrillos por día. °· Historia de infertilidad. °· Tener cierto tipo de sangre (A, B, AB). °· Tener déficit de vitamina A. °· El uso de anticonceptivos orales. °SIGNOS Y SÍNTOMAS °· Hemorragia vaginal. °· Falta del periodo menstrual. °· El útero crece más rápido de lo normal. °· Náuseas y vómitos intensos. °· Presión o dolor fuerte en el útero. °· Quistes ováricos anormales (quistes tecaluteínicos). °· Secreción vaginal similar a uvas. °· Presión arterial alta (inicio temprano de preeclampsia). °· Hiperactividad tiroidea (hipertiroidismo). °· Anemia. ° °DIAGNÓSTICO °Si el médico cree que existe la posibilidad de un embarazo molar, le indicará algunos estudios. Estos posibles estudios incluyen los siguientes: °· Una ecografía. °· Análisis de sangre. °TRATAMIENTO °La mayoría de los embarazos molares finalizan de manera  espontánea en un aborto. Sin embargo, el médico tiene que asegurarse de que no haya quedado tejido anormal dentro del útero. Esto se puede hacer mediante dilatación y curetaje (D y C) o legrado por aspiración. En este procedimiento, se extirpa todo el tejido molar restante a través de la vagina. °Después del diagnóstico de un embarazo molar, deben controlarse los niveles de hormona del embarazo hasta que el nivel sea cero. Si el nivel de hormona del embarazo no cae apropiadamente, será necesario realizar un tratamiento de quimioterapia. Además, también recibirá un medicamento llamado inmunoglobulina Rho (D) si su tipo de sangre es Rh negativo y el de su pareja sexual es Rh positivo. Esto ayuda a prevenir problemas con el factor Rh en próximos embarazos. °INSTRUCCIONES PARA EL CUIDADO EN EL HOGAR °· Evite quedar embarazada durante 6 a 12 meses o según las indicaciones del médico. Use un método anticonceptivo confiable o no tenga relaciones sexuales. °· Tome sólo medicamentos de venta libre o recetados, según las indicaciones del médico. °· Asista a todas las visitas de seguimiento y realícese todos los análisis de laboratorio y las ecografías sugeridas. °· Vuelva poco a poco a sus actividades normales. °· Considere participar en un grupo de apoyo. Pida ayuda si usted tiene dificultad para elaborar el duelo. ° °Esta información no tiene como fin reemplazar el consejo del médico. Asegúrese de hacerle al médico cualquier pregunta que tenga. °Document Released: 10/22/2011 Document Revised: 08/23/2013 Document Reviewed: 06/01/2013 °Elsevier Interactive Patient Education © 2017 [employer] °Molar Pregnancy °A molar pregnancy (hydatidiform mole) is a mass of tissue that grows in the uterus after conception. The mass is created by an egg that was not   fertilized correctly and abnormally grows. It is an abnormal pregnancy and does not develop into a fetus. If a molar pregnancy is suspected by your health care provider,  treatment is required. °What are the causes? °Molar pregnancy is caused by an egg that is fertilized incorrectly so that it has abnormal genetic material (chromosomes). This can result in one of 2 types of molar pregnancy: °· Complete molar pregnancy--All of the chromosomes in the fertilized egg come from the father; none come from the mother. °· Partial molar pregnancy--The fertilized egg has chromosomes from the father and mother, but it has too many chromosomes. ° °What increases the risk? °Certain risk factors make a molar pregnancy more likely. They include: °· Being over age 35 or under age 20. °· History of a molar pregnancy in the past (extremely small chance of recurrence). ° °Other possible risk factors include: °· Smoking more than 15 cigarettes per day. °· History of infertility. °· Having a certain blood type (A, B, AB). °· Having a vitamin A deficiency. °· Using oral contraceptives. ° °What are the signs or symptoms? °· Vaginal bleeding. °· Missed menstrual period. °· Uterus grows quicker than normal. °· Severe nausea and vomiting. °· Severe pressure or pain in the uterus. °· Abnormal ovarian cysts (theca lutein cysts). °· Discharge from the vagina that looks like grapes. °· High blood pressure (early onset of preeclampsia). °· Overactive thyroid (hyperthyroidism). °· Anemia. °How is this diagnosed? °If your health care provider thinks there is a chance of a molar pregnancy, testing will be recommended. Possible tests include: °· An ultrasound test. °· Blood tests. ° °How is this treated? °Most molar pregnancies end on their own by miscarriage. However, a health care provider needs to make sure that all the abnormal tissue is out of the womb. This can be done with dilation and curettage (D&C) or suction curettage. In this procedure, any remaining molar tissue is removed through the vagina. °After diagnosis of a molar pregnancy, the pregnancy hormone levels must be followed until the level is zero. If  the pregnancy hormone level does not drop appropriately, chemotherapy may be necessary. Also, you will be given a medicine called Rho (D) immune globulin if you are Rh negative and your sex partner is Rh positive. This helps prevent Rh problems in future pregnancies. °Follow these instructions at home: °· Avoid getting pregnant for 6-12 months or as directed by your health care provider. Use a reliable form of birth control or do not have sex. °· Only take over-the-counter or prescription medicine as directed by your health care provider. °· Keep all follow-up appointments and get all suggested lab tests and ultrasound tests. °· Gradually return to normal activities. °· Think about joining a support group. Ask for help if you are struggling with grief. °This information is not intended to replace advice given to you by your health care provider. Make sure you discuss any questions you have with your health care provider. °Document Released: 07/21/2011 Document Revised: 04/09/2016 Document Reviewed: 06/01/2013 °Elsevier Interactive Patient Education © 2017 [employer] ° °

## 2017-12-17 LAB — BETA HCG QUANT (REF LAB): hCG Quant: 11 m[IU]/mL

## 2017-12-22 ENCOUNTER — Other Ambulatory Visit: Payer: Self-pay | Admitting: Obstetrics and Gynecology

## 2017-12-22 DIAGNOSIS — O020 Blighted ovum and nonhydatidiform mole: Secondary | ICD-10-CM

## 2018-01-04 ENCOUNTER — Other Ambulatory Visit: Payer: Self-pay | Admitting: Internal Medicine

## 2018-01-13 ENCOUNTER — Other Ambulatory Visit: Payer: 59

## 2018-01-13 DIAGNOSIS — O020 Blighted ovum and nonhydatidiform mole: Secondary | ICD-10-CM

## 2018-01-14 LAB — BETA HCG QUANT (REF LAB): HCG QUANT: 3 m[IU]/mL

## 2018-01-19 ENCOUNTER — Other Ambulatory Visit: Payer: Self-pay | Admitting: Obstetrics and Gynecology

## 2018-01-19 DIAGNOSIS — O0889 Other complications following an ectopic and molar pregnancy: Secondary | ICD-10-CM

## 2018-03-10 ENCOUNTER — Ambulatory Visit: Payer: 59 | Admitting: Nurse Practitioner

## 2018-03-10 ENCOUNTER — Encounter: Payer: Self-pay | Admitting: Nurse Practitioner

## 2018-03-10 VITALS — BP 105/76 | HR 76 | Resp 16 | Ht 59.0 in | Wt 132.0 lb

## 2018-03-10 DIAGNOSIS — R300 Dysuria: Secondary | ICD-10-CM

## 2018-03-11 LAB — URINALYSIS, ROUTINE W REFLEX MICROSCOPIC
BILIRUBIN UA: NEGATIVE
GLUCOSE, UA: NEGATIVE
KETONES UA: NEGATIVE
Leukocytes, UA: NEGATIVE
Nitrite, UA: NEGATIVE
Protein, UA: NEGATIVE
RBC UA: NEGATIVE
SPEC GRAV UA: 1.026 (ref 1.005–1.030)
UUROB: 0.2 mg/dL (ref 0.2–1.0)
pH, UA: 5 (ref 5.0–7.5)

## 2018-03-15 ENCOUNTER — Encounter: Payer: Self-pay | Admitting: Nurse Practitioner

## 2018-03-15 ENCOUNTER — Other Ambulatory Visit: Payer: Self-pay | Admitting: Internal Medicine

## 2018-03-15 ENCOUNTER — Ambulatory Visit (INDEPENDENT_AMBULATORY_CARE_PROVIDER_SITE_OTHER): Payer: 59 | Admitting: Nurse Practitioner

## 2018-03-15 VITALS — BP 110/70 | HR 79 | Resp 16 | Ht 59.0 in | Wt 130.6 lb

## 2018-03-15 DIAGNOSIS — R7301 Impaired fasting glucose: Secondary | ICD-10-CM | POA: Diagnosis not present

## 2018-03-15 DIAGNOSIS — D509 Iron deficiency anemia, unspecified: Secondary | ICD-10-CM

## 2018-03-15 DIAGNOSIS — E782 Mixed hyperlipidemia: Secondary | ICD-10-CM | POA: Diagnosis not present

## 2018-03-15 DIAGNOSIS — Z0001 Encounter for general adult medical examination with abnormal findings: Secondary | ICD-10-CM

## 2018-03-15 DIAGNOSIS — E559 Vitamin D deficiency, unspecified: Secondary | ICD-10-CM

## 2018-03-15 NOTE — Progress Notes (Signed)
[facility] [address] [place], [place] [address]  Internal MEDICINE  Office Visit Note  Patient Name: Destiny Allen  [id number]  [id number]  Date of Service: 04/03/2018  Pt is here for routine health maintenance examination  Chief Complaint  Patient presents with  . Annual Exam   The patient has no negative concerns or complaints at this time.    Current Medication: No outpatient encounter medications on file as of 03/15/2018.   No facility-administered encounter medications on file as of 03/15/2018.     Surgical History: Past Surgical History:  Procedure Laterality Date  . CESAREAN SECTION  2011  . DILATION AND EVACUATION N/A 11/12/2017   Procedure: DILATATION AND EVACUATION;  Surgeon: Vena Austria, MD;  Location: [facility] ORS;  Service: Gynecology;  Laterality: N/A;    Medical History: Past Medical History:  Diagnosis Date  . Infertility associated with anovulation   . Ovarian cyst     Family History: Family History  Problem Relation Age of Onset  . Hypertension Father       Review of Systems  Constitutional: Negative for activity change, chills, fatigue and unexpected weight change.  HENT: Negative for congestion, postnasal drip, rhinorrhea and sore throat.   Eyes: Negative.  Negative for redness.  Respiratory: Negative for cough, chest tightness, shortness of breath and wheezing.   Cardiovascular: Negative for chest pain and palpitations.  Gastrointestinal: Negative for abdominal distention, abdominal pain, constipation, diarrhea, nausea and vomiting.  Endocrine: Negative for cold intolerance, heat intolerance, polydipsia, polyphagia and polyuria.  Genitourinary: Negative for dyspareunia, dysuria, frequency, vaginal bleeding and vaginal discharge.       Menstrual periods have returned to normal after recent miscarriage.   Musculoskeletal: Negative for arthralgias, back pain, joint swelling and neck pain.  Skin: Negative for rash.   Allergic/Immunologic: Negative for environmental allergies.  Neurological: Negative.  Negative for tremors and numbness.  Hematological: Negative for adenopathy. Does not bruise/bleed easily.  Psychiatric/Behavioral: Negative for behavioral problems (Depression), sleep disturbance and suicidal ideas. The patient is not nervous/anxious.     Today's Vitals   03/15/18 0931  BP: 110/70  Pulse: 79  Resp: 16  SpO2: 99%  Weight: 130 lb 9.6 oz (59.2 kg)  Height:  (1.499 m)    Physical Exam  Constitutional: She is oriented to person, place, and time. She appears well-developed and well-nourished. No distress.  HENT:  Head: Normocephalic and atraumatic.  Nose: Nose normal.  Mouth/Throat: Oropharynx is clear and moist. No oropharyngeal exudate.  Eyes: Pupils are equal, round, and reactive to light. Conjunctivae and EOM are normal.  Neck: Normal range of motion. Neck supple. No JVD present. No tracheal deviation present. No thyromegaly present.  Cardiovascular: Normal rate, regular rhythm, normal heart sounds and intact distal pulses. Exam reveals no gallop and no friction rub.  No murmur heard. Pulmonary/Chest: Effort normal and breath sounds normal. No respiratory distress. She has no wheezes. She has no rales. She exhibits no tenderness. Right breast exhibits no inverted nipple, no mass, no nipple discharge, no skin change and no tenderness. Left breast exhibits no inverted nipple, no mass, no nipple discharge, no skin change and no tenderness.  Abdominal: Soft. Bowel sounds are normal. There is no tenderness.  Musculoskeletal: Normal range of motion.  Lymphadenopathy:    She has no cervical adenopathy.  Neurological: She is alert and oriented to person, place, and time. No cranial nerve deficit. Coordination normal.  Skin: Skin is warm and dry. Capillary refill takes less than  2 seconds. She is not diaphoretic.  Psychiatric: She has a normal mood and affect. Her behavior is normal.  Judgment and thought content normal.  Nursing note and vitals reviewed.    LABS: Recent Results (from the past 2160 hour(s))  Beta HCG, Quant     Status: None   Collection Time: 01/13/18 10:34 AM  Result Value Ref Range   hCG Quant 3 mIU/mL    Comment:                      Female (Non-pregnant)    0 -     5                             (Postmenopausal)  0 -     8                      Female (Pregnant)                      Weeks of Gestation                              3                6 -    71                              4               10 -   750                              5              217 -  7138                              6              158 - 31795                              7             [phone]                              8            32065 -[phone]                              5            62130 -[phone]                             69            [phone] -[phone]                             40            [phone]  14            13950 - 62530                             15            12039 - 70971                             16             9040 - 56451                             17             8175 - 55868                             18             [phone] Roche E CLIA methodology   Urinalysis, Routine w reflex microscopic     Status: None   Collection Time: 03/10/18  3:50 PM  Result Value Ref Range   Specific Gravity, UA 1.026 1.005 - 1.030   pH, UA 5.0 5.0 - 7.5   Color, UA Yellow Yellow   Appearance Ur Clear Clear   Leukocytes, UA Negative Negative   Protein, UA Negative Negative/Trace   Glucose, UA Negative Negative   Ketones, UA Negative Negative   RBC, UA Negative Negative   Bilirubin, UA Negative Negative   Urobilinogen, Ur 0.2 0.2 - 1.0 mg/dL   Nitrite, UA Negative Negative   Microscopic Examination Comment     Comment: Microscopic not indicated and not performed.  CBC with Differential/Platelet     Status:  None   Collection Time: 03/15/18 10:32 AM  Result Value Ref Range   WBC 6.1 3.4 - 10.8 x10E3/uL   RBC 4.67 3.77 - 5.28 x10E6/uL   Hemoglobin 13.5 11.1 - 15.9 g/dL   Hematocrit 78.2 95.6 - 46.6 %   MCV 83 79 - 97 fL   MCH 28.9 26.6 - 33.0 pg   MCHC 34.7 31.5 - 35.7 g/dL   RDW 21.3 08.6 - 57.8 %   Platelets 286 150 - 379 x10E3/uL   Neutrophils 52 Not Estab. %   Lymphs 39 Not Estab. %   Monocytes 7 Not Estab. %   Eos 1 Not Estab. %   Basos 1 Not Estab. %   Neutrophils Absolute 3.3 1.4 - 7.0 x10E3/uL   Lymphocytes Absolute 2.4 0.7 - 3.1 x10E3/uL   Monocytes Absolute 0.4 0.1 - 0.9 x10E3/uL   EOS (ABSOLUTE) 0.0 0.0 - 0.4 x10E3/uL   Basophils Absolute 0.0 0.0 - 0.2 x10E3/uL   Immature Granulocytes 0 Not Estab. %   Immature Grans (Abs) 0.0 0.0 - 0.1 x10E3/uL  Comprehensive metabolic panel     Status: None   Collection Time: 03/15/18 10:32 AM  Result Value Ref Range   Glucose 88 65 - 99 mg/dL   BUN 11 6 - 20 mg/dL   Creatinine, Ser 4.69 0.57 - 1.00 mg/dL   GFR calc non Af Amer 103 >59 mL/min/1.73   GFR calc Af Amer 119 >59 mL/min/1.73   BUN/Creatinine Ratio 14 9 - 23   Sodium 141 134 - 144 mmol/L   Potassium 4.4 3.5 - 5.2 mmol/L  Chloride 104 96 - 106 mmol/L   CO2 23 20 - 29 mmol/L   Calcium 9.4 8.7 - 10.2 mg/dL   Total Protein 7.2 6.0 - 8.5 g/dL   Albumin 4.6 3.5 - 5.5 g/dL   Globulin, Total 2.6 1.5 - 4.5 g/dL   Albumin/Globulin Ratio 1.8 1.2 - 2.2   Bilirubin Total 0.6 0.0 - 1.2 mg/dL   Alkaline Phosphatase 63 39 - 117 IU/L   AST 15 0 - 40 IU/L   ALT 8 0 - 32 IU/L  T4, free     Status: None   Collection Time: 03/15/18 10:32 AM  Result Value Ref Range   Free T4 1.04 0.82 - 1.77 ng/dL  TSH     Status: None   Collection Time: 03/15/18 10:32 AM  Result Value Ref Range   TSH 2.110 0.450 - 4.500 uIU/mL  Lipid panel     Status: Abnormal   Collection Time: 03/15/18 10:32 AM  Result Value Ref Range   Cholesterol, Total 198 100 - 199 mg/dL   Triglycerides 52 0 - 149 mg/dL    HDL 68 >16 mg/dL   VLDL Cholesterol Cal 10 5 - 40 mg/dL   LDL Calculated 109 (H) 0 - 99 mg/dL   Chol/HDL Ratio 2.9 0.0 - 4.4 ratio    Comment:                                   T. Chol/HDL Ratio                                             Men  Women                               1/2 Avg.Risk  3.4    3.3                                   Avg.Risk  5.0    4.4                                2X Avg.Risk  9.6    7.1                                3X Avg.Risk 23.4   11.0   Vitamin D 1,25 dihydroxy     Status: None   Collection Time: 03/15/18 10:32 AM  Result Value Ref Range   Vitamin D 1, 25 (OH)2 Total 56 pg/mL    Comment: Reference Range: Adults: 21 - 65    Vitamin D2 1, 25 (OH)2 <10 pg/mL   Vitamin D3 1, 25 (OH)2 56 pg/mL  HgB A1c     Status: None   Collection Time: 03/15/18 10:32 AM  Result Value Ref Range   Hgb A1c MFr Bld 5.3 4.8 - 5.6 %    Comment:          Prediabetes: 5.7 - 6.4          Diabetes: >6.4          Glycemic control for  adults with diabetes: <7.0    Est. average glucose Bld gHb Est-mCnc 105 mg/dL    Assessment/Plan: 1. Encounter for general adult medical examination with abnormal findings Annual health maintenance exam performed today. - Comprehensive metabolic panel - T4, free - TSH  2. Iron deficiency anemia, unspecified iron deficiency anemia type Check cbc and thyroid panel.  - CBC with Differential/Platelet - T4, free - TSH  3. Mixed hyperlipidemia Fasting lipid panel ordered.  - Lipid panel  4. Vitamin D deficiency - Vitamin D 1,25 dihydroxy  5. Impaired fasting glucose - HgB A1c  General Counseling: Peter verbalizes understanding of the findings of todays visit and agrees with plan of treatment. I have discussed any further diagnostic evaluation that may be needed or ordered today. We also reviewed her medications today. she has been encouraged to call the office with any questions or concerns that should arise related to todays visit.  This  patient was seen by Vincent Gros, FNP- C in Collaboration with Dr Lyndon Code as a part of collaborative care agreement    Orders Placed This Encounter  Procedures  . CBC with Differential/Platelet  . Comprehensive metabolic panel  . T4, free  . TSH  . Lipid panel  . Vitamin D 1,25 dihydroxy  . HgB A1c      Time spent: 35 Minutes    Lyndon Code, MD  Internal Medicine

## 2018-03-21 LAB — T4, FREE: FREE T4: 1.04 ng/dL (ref 0.82–1.77)

## 2018-03-21 LAB — COMPREHENSIVE METABOLIC PANEL
A/G RATIO: 1.8 (ref 1.2–2.2)
ALBUMIN: 4.6 g/dL (ref 3.5–5.5)
ALK PHOS: 63 IU/L (ref 39–117)
ALT: 8 IU/L (ref 0–32)
AST: 15 IU/L (ref 0–40)
BUN / CREAT RATIO: 14 (ref 9–23)
BUN: 11 mg/dL (ref 6–20)
Bilirubin Total: 0.6 mg/dL (ref 0.0–1.2)
CO2: 23 mmol/L (ref 20–29)
Calcium: 9.4 mg/dL (ref 8.7–10.2)
Chloride: 104 mmol/L (ref 96–106)
Creatinine, Ser: 0.76 mg/dL (ref 0.57–1.00)
GFR calc Af Amer: 119 mL/min/{1.73_m2} (ref >59)
GFR calc non Af Amer: 103 mL/min/{1.73_m2} (ref >59)
GLOBULIN, TOTAL: 2.6 g/dL (ref 1.5–4.5)
Glucose: 88 mg/dL (ref 65–99)
POTASSIUM: 4.4 mmol/L (ref 3.5–5.2)
SODIUM: 141 mmol/L (ref 134–144)
Total Protein: 7.2 g/dL (ref 6.0–8.5)

## 2018-03-21 LAB — CBC WITH DIFFERENTIAL/PLATELET
BASOS: 1 %
Basophils Absolute: 0 10*3/uL (ref 0.0–0.2)
EOS (ABSOLUTE): 0 10*3/uL (ref 0.0–0.4)
Eos: 1 %
HEMOGLOBIN: 13.5 g/dL (ref 11.1–15.9)
Hematocrit: 38.9 % (ref 34.0–46.6)
Immature Grans (Abs): 0 10*3/uL (ref 0.0–0.1)
Immature Granulocytes: 0 %
LYMPHS ABS: 2.4 10*3/uL (ref 0.7–3.1)
Lymphs: 39 %
MCH: 28.9 pg (ref 26.6–33.0)
MCHC: 34.7 g/dL (ref 31.5–35.7)
MCV: 83 fL (ref 79–97)
MONOCYTES: 7 %
MONOS ABS: 0.4 10*3/uL (ref 0.1–0.9)
NEUTROS ABS: 3.3 10*3/uL (ref 1.4–7.0)
Neutrophils: 52 %
Platelets: 286 10*3/uL (ref 150–379)
RBC: 4.67 x10E6/uL (ref 3.77–5.28)
RDW: 13.6 % (ref 12.3–15.4)
WBC: 6.1 10*3/uL (ref 3.4–10.8)

## 2018-03-21 LAB — VITAMIN D 1,25 DIHYDROXY
VITAMIN D 1, 25 (OH) TOTAL: 56 pg/mL
Vitamin D3 1, 25 (OH)2: 56 pg/mL

## 2018-03-21 LAB — LIPID PANEL
CHOLESTEROL TOTAL: 198 mg/dL (ref 100–199)
Chol/HDL Ratio: 2.9 ratio (ref 0.0–4.4)
HDL: 68 mg/dL (ref >39)
LDL Calculated: 120 mg/dL — ABNORMAL HIGH (ref 0–99)
Triglycerides: 52 mg/dL (ref 0–149)
VLDL Cholesterol Cal: 10 mg/dL (ref 5–40)

## 2018-03-21 LAB — HEMOGLOBIN A1C
ESTIMATED AVERAGE GLUCOSE: 105 mg/dL
Hgb A1c MFr Bld: 5.3 % (ref 4.8–5.6)

## 2018-03-21 LAB — TSH: TSH: 2.11 u[IU]/mL (ref 0.450–4.500)

## 2018-04-03 DIAGNOSIS — D509 Iron deficiency anemia, unspecified: Secondary | ICD-10-CM | POA: Insufficient documentation

## 2018-04-03 DIAGNOSIS — R7301 Impaired fasting glucose: Secondary | ICD-10-CM | POA: Insufficient documentation

## 2018-04-03 DIAGNOSIS — Z0001 Encounter for general adult medical examination with abnormal findings: Secondary | ICD-10-CM | POA: Insufficient documentation

## 2018-04-03 DIAGNOSIS — E559 Vitamin D deficiency, unspecified: Secondary | ICD-10-CM | POA: Insufficient documentation

## 2018-04-03 DIAGNOSIS — E782 Mixed hyperlipidemia: Secondary | ICD-10-CM | POA: Insufficient documentation

## 2018-09-12 ENCOUNTER — Other Ambulatory Visit
Admission: RE | Admit: 2018-09-12 | Discharge: 2018-09-12 | Disposition: A | Discharge status: Home / Self Care | Payer: Managed Care, Other (non HMO) | Source: Ambulatory Visit | Attending: Otolaryngology | Admitting: Otolaryngology

## 2018-09-12 ENCOUNTER — Ambulatory Visit: Payer: Self-pay | Admitting: Nurse Practitioner

## 2018-09-12 ENCOUNTER — Other Ambulatory Visit: Payer: Self-pay | Admitting: Otolaryngology

## 2018-09-12 DIAGNOSIS — E041 Nontoxic single thyroid nodule: Secondary | ICD-10-CM | POA: Diagnosis not present

## 2018-09-12 LAB — TSH: TSH: 0.014 u[IU]/mL — ABNORMAL LOW (ref 0.350–4.500)

## 2018-09-12 LAB — T4, FREE: Free T4: 1.9 ng/dL — ABNORMAL HIGH (ref 0.82–1.77)

## 2018-09-13 LAB — T3, FREE: T3 FREE: 5.1 pg/mL — AB (ref 2.0–4.4)

## 2018-09-15 ENCOUNTER — Ambulatory Visit
Admission: RE | Admit: 2018-09-15 | Discharge: 2018-09-15 | Disposition: A | Discharge status: Home / Self Care | Payer: Managed Care, Other (non HMO) | Source: Ambulatory Visit | Attending: Otolaryngology | Admitting: Otolaryngology

## 2018-09-15 DIAGNOSIS — E041 Nontoxic single thyroid nodule: Secondary | ICD-10-CM | POA: Diagnosis not present

## 2018-10-03 ENCOUNTER — Encounter: Payer: Self-pay | Admitting: Adult Health

## 2018-10-03 ENCOUNTER — Ambulatory Visit: Payer: 59 | Admitting: Adult Health

## 2018-10-03 VITALS — BP 112/72 | HR 120 | Temp 99.1°F | Resp 16 | Ht 59.0 in | Wt 122.0 lb

## 2018-10-03 DIAGNOSIS — R509 Fever, unspecified: Secondary | ICD-10-CM | POA: Diagnosis not present

## 2018-10-03 DIAGNOSIS — R6889 Other general symptoms and signs: Secondary | ICD-10-CM

## 2018-10-03 DIAGNOSIS — J101 Influenza due to other identified influenza virus with other respiratory manifestations: Secondary | ICD-10-CM

## 2018-10-03 LAB — POCT INFLUENZA A/B
Influenza A, POC: POSITIVE — AB
Influenza B, POC: NEGATIVE

## 2018-10-03 MED ORDER — OSELTAMIVIR PHOSPHATE 75 MG PO CAPS: 75.0000 mg | ORAL_CAPSULE | Freq: Two times a day (BID) | ORAL | 0 refills | Status: DC

## 2018-10-03 NOTE — Progress Notes (Signed)
[facility] [address] [place], [place] [address]  Internal MEDICINE  Office Visit Note  Patient Name: Destiny PerlRosa I Allen  [id number]  [id number]  Date of Service: 10/03/2018  Chief Complaint  Patient presents with  . Cough    symptoms started on Friday   . Fever  . Generalized Body Aches     HPI Pt is here for a sick visit. Pt reports 3 days of fever, body aches and sore throat.  She reports her kids have been sick for over a week.  She denies sob, headaches.  She reports productive cough, with yellow mucous.  She has been taking Advil and Mucinex with mild relief. She has not gotten a Flu shot yet this year.    Current Medication:  Outpatient Encounter Medications as of 10/03/2018  Medication Sig  . guaiFENesin (MUCINEX) 600 MG 12 hr tablet Take by mouth 2 (two) times daily.  Marland Kitchen. oseltamivir (TAMIFLU) 75 MG capsule Take 1 capsule (75 mg total) by mouth 2 (two) times daily.   No facility-administered encounter medications on file as of 10/03/2018.       Medical History: Past Medical History:  Diagnosis Date  . Infertility associated with anovulation   . Ovarian cyst      Vital Signs: BP 112/72 (BP Location: Left Arm, Patient Position: Sitting, Cuff Size: Normal)   Pulse (!) 120   Temp 99.1 F (37.3 C)   Resp 16   Ht 4\' 11"  (1.499 m)   Wt 122 lb (55.3 kg)   SpO2 98%   BMI 24.64 kg/m    Review of Systems  Constitutional: Positive for chills, fatigue and fever. Negative for unexpected weight change.  HENT: Positive for sore throat. Negative for congestion, rhinorrhea and sneezing.   Eyes: Negative for photophobia, pain and redness.  Respiratory: Positive for cough. Negative for chest tightness and shortness of breath.   Cardiovascular: Negative for chest pain and palpitations.  Gastrointestinal: Negative for abdominal pain, constipation, diarrhea, nausea and vomiting.  Endocrine: Negative.   Genitourinary: Negative for dysuria and frequency.   Musculoskeletal: Negative for arthralgias, back pain, joint swelling and neck pain.  Skin: Negative for rash.  Allergic/Immunologic: Negative.   Neurological: Negative for tremors and numbness.  Hematological: Negative for adenopathy. Does not bruise/bleed easily.  Psychiatric/Behavioral: Negative for behavioral problems and sleep disturbance. The patient is not nervous/anxious.     Physical Exam  Constitutional: She is oriented to person, place, and time. She appears well-developed and well-nourished. No distress.  HENT:  Head: Normocephalic and atraumatic.  Mouth/Throat: No oropharyngeal exudate.  Mild erythema to pharynx.  Eyes: Pupils are equal, round, and reactive to light. EOM are normal.  Neck: Normal range of motion. Neck supple. No JVD present. No tracheal deviation present. No thyromegaly present.  Cardiovascular: Normal rate, regular rhythm and normal heart sounds. Exam reveals no gallop and no friction rub.  No murmur heard. Pulmonary/Chest: Effort normal and breath sounds normal. No respiratory distress. She has no wheezes. She has no rales. She exhibits no tenderness.  Diminished breath.   Abdominal: Soft. There is no tenderness. There is no guarding.  Musculoskeletal: Normal range of motion.  Lymphadenopathy:    She has no cervical adenopathy.  Neurological: She is alert and oriented to person, place, and time. No cranial nerve deficit.  Skin: Skin is warm and dry. She is not diaphoretic.  Psychiatric: She has a normal mood and affect. Her behavior is normal. Judgment and thought content normal.  Nursing  note and vitals reviewed.  Assessment/Plan: 1. Influenza A Discussed Tamiflu with patient.  Also discussed that her 52-year-old son at home most likely has the flu since he has been sick longer than her.  Encouraged continue to drink plenty of fluids and rest.  To stay away from other family members who are not sick.  Do not go to work for the next few days until she is  feeling better. - oseltamivir (TAMIFLU) 75 MG capsule; Take 1 capsule (75 mg total) by mouth 2 (two) times daily.  Dispense: 10 capsule; Refill: 0  2. Fever and chills Instructed patient to continue to treat her symptoms.  She may use Tylenol or Motrin for fever.  3. Flu-like symptoms Positive Influenza A test.  - POCT Influenza A/B  General Counseling: Destiny Allen verbalizes understanding of the findings of todays visit and agrees with plan of treatment. I have discussed any further diagnostic evaluation that may be needed or ordered today. We also reviewed her medications today. she has been encouraged to call the office with any questions or concerns that should arise related to todays visit.   Orders Placed This Encounter  Procedures  . POCT Influenza A/B    Meds ordered this encounter  Medications  . oseltamivir (TAMIFLU) 75 MG capsule    Sig: Take 1 capsule (75 mg total) by mouth 2 (two) times daily.    Dispense:  10 capsule    Refill:  0    Time spent: 25 Minutes  This patient was seen by Blima Ledger AGNP-C in Collaboration with Dr Lyndon Code as a part of collaborative care agreement.  Johnna Acosta AGNP-C Internal Medicine

## 2018-10-03 NOTE — Patient Instructions (Signed)

## 2019-01-13 ENCOUNTER — Other Ambulatory Visit: Payer: Self-pay | Admitting: Obstetrics and Gynecology

## 2019-01-13 ENCOUNTER — Ambulatory Visit (INDEPENDENT_AMBULATORY_CARE_PROVIDER_SITE_OTHER): Payer: 59

## 2019-01-13 ENCOUNTER — Ambulatory Visit (INDEPENDENT_AMBULATORY_CARE_PROVIDER_SITE_OTHER): Payer: 59 | Admitting: Obstetrics and Gynecology

## 2019-01-13 ENCOUNTER — Encounter: Payer: Self-pay | Admitting: Obstetrics and Gynecology

## 2019-01-13 VITALS — BP 104/60 | Wt 136.0 lb

## 2019-01-13 DIAGNOSIS — O09A0 Supervision of pregnancy with history of molar pregnancy, unspecified trimester: Secondary | ICD-10-CM

## 2019-01-13 DIAGNOSIS — N912 Amenorrhea, unspecified: Secondary | ICD-10-CM

## 2019-01-13 DIAGNOSIS — Z8759 Personal history of other complications of pregnancy, childbirth and the puerperium: Secondary | ICD-10-CM

## 2019-01-13 DIAGNOSIS — O0990 Supervision of high risk pregnancy, unspecified, unspecified trimester: Secondary | ICD-10-CM

## 2019-01-13 DIAGNOSIS — O3481 Maternal care for other abnormalities of pelvic organs, first trimester: Secondary | ICD-10-CM

## 2019-01-13 DIAGNOSIS — O3680X0 Pregnancy with inconclusive fetal viability, not applicable or unspecified: Secondary | ICD-10-CM

## 2019-01-13 DIAGNOSIS — N8311 Corpus luteum cyst of right ovary: Secondary | ICD-10-CM | POA: Diagnosis not present

## 2019-01-13 NOTE — Progress Notes (Signed)
NOB C/o some cramping in the LQ and back  Flu shot in September  Home pregnancy test

## 2019-01-13 NOTE — Progress Notes (Signed)
Patient ID: Destiny Allen, female   DOB: 11/23/1985, 33 y.o.   MRN: [id number]  Reason for Consult: Initial Prenatal Visit   Referred by Carlean Jews, NP  Subjective:     HPI:  Destiny Allen is a 33 y.o. female. She presents today for amenorrhea. She missed her period last week. She took a pregnancy test and it was positive. She has had some lower abdominal cramping pain and breast tenderness. She denies vaginal bleeding. She denies nausea.   She has a history of a partial molar pregnancy in 10/2017 as well as a two ectopic pregnancies that were treated with methotrexate. She says that she has had both a left and right ectopic pregnancy.   She reports that since the molar pregnancy that she has been having regular monthly menses with 6 days of bleeding.  She has not had bleeding between her periods or bleeding after intercourse.   OB History  Gravida Para Term Preterm AB Living  5 1 1   3 1   SAB TAB Ectopic Multiple Live Births  0   2   1    # Outcome Date GA Lbr Len/2nd Weight Sex Delivery Anes PTL Lv  5 Current           4 Molar 11/12/17             Complications: Partial molar pregnancy  3 Term 2011 [redacted]w[redacted]d  7 lb (3.175 kg) M CS-Unspec   LIV  2 Ectopic           1 Ectopic             Past Medical History:  Diagnosis Date  . Infertility associated with anovulation   . Ovarian cyst    Family History  Problem Relation Age of Onset  . Hypertension Father    Past Surgical History:  Procedure Laterality Date  . CESAREAN SECTION  2011  . DILATION AND EVACUATION N/A 11/12/2017   Procedure: DILATATION AND EVACUATION;  Surgeon: Vena Austria, MD;  Location: [facility] ORS;  Service: Gynecology;  Laterality: N/A;    Short Social History:  Social History   Tobacco Use  . Smoking status: Never Smoker  . Smokeless tobacco: Never Used  Substance Use Topics  . Alcohol use: No    No Known Allergies  Current Outpatient Medications  Medication Sig Dispense Refill  .  PRENATAL 28-0.8 MG TABS Take by mouth.     No current facility-administered medications for this visit.     Review of Systems  Constitutional: Negative for chills, fatigue, fever and unexpected weight change.  HENT: Negative for trouble swallowing.  Eyes: Negative for loss of vision.  Respiratory: Negative for cough, shortness of breath and wheezing.  Cardiovascular: Negative for chest pain, leg swelling, palpitations and syncope.  GI: Negative for abdominal pain, blood in stool, diarrhea, nausea and vomiting.  GU: Negative for difficulty urinating, dysuria, frequency and hematuria.  Musculoskeletal: Negative for back pain, leg pain and joint pain.  Skin: Negative for rash.  Neurological: Negative for dizziness, headaches, light-headedness, numbness and seizures.  Psychiatric: Negative for behavioral problem, confusion, depressed mood and sleep disturbance.        Objective:  Objective   Vitals:   01/13/19 1324  BP: 104/60  Weight: 136 lb (61.7 kg)   Body mass index is 27.47 kg/m.  Physical Exam Vitals signs and nursing note reviewed.  Constitutional:      Appearance: She is well-developed.  HENT:  Head: Normocephalic and atraumatic.  Eyes:     Pupils: Pupils are equal, round, and reactive to light.  Cardiovascular:     Rate and Rhythm: Normal rate and regular rhythm.  Pulmonary:     Effort: Pulmonary effort is normal. No respiratory distress.  Skin:    General: Skin is warm and dry.  Neurological:     Mental Status: She is alert and oriented to person, place, and time.  Psychiatric:        Behavior: Behavior normal.        Thought Content: Thought content normal.        Judgment: Judgment normal.         Assessment/Plan:     33 yo with amenorrhea Today's urine dip stick is faintly positive in office. Will obtain beta hcg and transvaginal US. Plan for repeat beta hcg on Sunday. Will need close follow up given her hx of ectopic and molar pregnancies.    Update: No IUP or ectopic pregnancy seen on [place]. She can not have labs on Sunday. She will follow up next week for labs on Monday and Wednesday. She will have another [place] in 2 weeks.   More than 25 minutes were spent face to face with the patient in the room with more than 50% of the time spent providing counseling and discussing the plan of management.      Adelene Idler MD [facility], [facility] 01/13/2019 2:02 PM

## 2019-01-14 LAB — BETA HCG QUANT (REF LAB): HCG QUANT: 624 m[IU]/mL

## 2019-01-15 LAB — URINE CULTURE

## 2019-01-16 ENCOUNTER — Other Ambulatory Visit: Payer: 59

## 2019-01-16 DIAGNOSIS — O3680X0 Pregnancy with inconclusive fetal viability, not applicable or unspecified: Secondary | ICD-10-CM

## 2019-01-16 LAB — MONITOR DRUG PROFILE 10(MW)
AMPHETAMINE SCREEN URINE: NEGATIVE ng/mL
BARBITURATE SCREEN URINE: NEGATIVE ng/mL
BENZODIAZEPINE SCREEN, URINE: NEGATIVE ng/mL
CANNABINOIDS UR QL SCN: NEGATIVE ng/mL
COCAINE(METAB.)SCREEN, URINE: NEGATIVE ng/mL
Creatinine(Crt), U: 24.4 mg/dL (ref 20.0–300.0)
Methadone Screen, Urine: NEGATIVE ng/mL
OPIATE SCREEN URINE: NEGATIVE ng/mL
OXYCODONE+OXYMORPHONE UR QL SCN: NEGATIVE ng/mL
Ph of Urine: 5.6 (ref 4.5–8.9)
Phencyclidine Qn, Ur: NEGATIVE ng/mL
Propoxyphene Scrn, Ur: NEGATIVE ng/mL

## 2019-01-17 LAB — BETA HCG QUANT (REF LAB): hCG Quant: 1677 m[IU]/mL

## 2019-01-17 NOTE — Progress Notes (Signed)
Called and discussed with patient. Levels have been rising appropriately, continue to monitor. Patient reported constipation. No severe abdominal pain, mild left sided pain which resolved Saturday. No vaginal bleeding.

## 2019-01-18 ENCOUNTER — Other Ambulatory Visit: Payer: 59

## 2019-01-18 DIAGNOSIS — O3680X0 Pregnancy with inconclusive fetal viability, not applicable or unspecified: Secondary | ICD-10-CM

## 2019-01-19 ENCOUNTER — Other Ambulatory Visit: Payer: Self-pay | Admitting: Obstetrics and Gynecology

## 2019-01-19 DIAGNOSIS — K5901 Slow transit constipation: Secondary | ICD-10-CM

## 2019-01-19 LAB — BETA HCG QUANT (REF LAB): hCG Quant: 3765 m[IU]/mL

## 2019-01-19 MED ORDER — DOCUSATE SODIUM 100 MG PO CAPS: 100.0000 mg | ORAL_CAPSULE | Freq: Two times a day (BID) | ORAL | 2 refills | Status: DC | PRN

## 2019-01-19 NOTE — Progress Notes (Signed)
Called and discussed with patient. Normal increase. Will plan for [place] next week. Patient having some constipation but no abdominal pain or vaginal bleeding.

## 2019-01-25 ENCOUNTER — Other Ambulatory Visit: Payer: Self-pay | Admitting: Obstetrics and Gynecology

## 2019-01-25 ENCOUNTER — Ambulatory Visit (INDEPENDENT_AMBULATORY_CARE_PROVIDER_SITE_OTHER): Payer: 59 | Admitting: Obstetrics and Gynecology

## 2019-01-25 ENCOUNTER — Other Ambulatory Visit: Payer: Self-pay

## 2019-01-25 ENCOUNTER — Ambulatory Visit (INDEPENDENT_AMBULATORY_CARE_PROVIDER_SITE_OTHER): Payer: 59

## 2019-01-25 ENCOUNTER — Encounter: Payer: Self-pay | Admitting: Obstetrics and Gynecology

## 2019-01-25 VITALS — BP 120/70 | HR 103 | Ht 59.0 in | Wt 136.0 lb

## 2019-01-25 DIAGNOSIS — O3481 Maternal care for other abnormalities of pelvic organs, first trimester: Secondary | ICD-10-CM | POA: Diagnosis not present

## 2019-01-25 DIAGNOSIS — O3680X0 Pregnancy with inconclusive fetal viability, not applicable or unspecified: Secondary | ICD-10-CM

## 2019-01-25 DIAGNOSIS — Z3A01 Less than 8 weeks gestation of pregnancy: Secondary | ICD-10-CM

## 2019-01-25 DIAGNOSIS — N8311 Corpus luteum cyst of right ovary: Secondary | ICD-10-CM | POA: Diagnosis not present

## 2019-01-25 DIAGNOSIS — Z3490 Encounter for supervision of normal pregnancy, unspecified, unspecified trimester: Secondary | ICD-10-CM | POA: Diagnosis not present

## 2019-01-25 NOTE — Progress Notes (Signed)
Patient ID: Destiny Allen, female   DOB: [birth year], 33 y.o.   MRN: [id number]  Reason for Consult: Follow-up (GYN U/S follow up )   Referred by Carlean Jews, NP  Subjective:     HPI:  Destiny Allen is a 33 y.o. female . She is here for follow up for pregnancy of unknown location. She had normally increasing beta hcg and today an IUP is seen dating 5 weeks and 5 days with a EDD of 09/20/2019. She is starting to exhibit symptoms of pregnancy such as nausea, headache, and breast tenderness. She is taking a prenatal. She is happy with today's findings.   Past Medical History:  Diagnosis Date  . Infertility associated with anovulation   . Ovarian cyst    Family History  Problem Relation Age of Onset  . Hypertension Father    Past Surgical History:  Procedure Laterality Date  . CESAREAN SECTION  2011  . DILATION AND EVACUATION N/A 11/12/2017   Procedure: DILATATION AND EVACUATION;  Surgeon: Vena Austria, MD;  Location: [facility] ORS;  Service: Gynecology;  Laterality: N/A;    Short Social History:  Social History   Tobacco Use  . Smoking status: Never Smoker  . Smokeless tobacco: Never Used  Substance Use Topics  . Alcohol use: No    No Known Allergies  Current Outpatient Medications  Medication Sig Dispense Refill  . docusate sodium (COLACE) 100 MG capsule Take 1 capsule (100 mg total) by mouth 2 (two) times daily as needed. 30 capsule 2  . PRENATAL 28-0.8 MG TABS Take by mouth.     No current facility-administered medications for this visit.     Review of Systems  Constitutional: Negative for chills, fatigue, fever and unexpected weight change.  HENT: Negative for trouble swallowing.  Eyes: Negative for loss of vision.  Respiratory: Negative for cough, shortness of breath and wheezing.  Cardiovascular: Negative for chest pain, leg swelling, palpitations and syncope.  GI: Negative for abdominal pain, blood in stool, diarrhea, nausea and vomiting.  GU:  Negative for difficulty urinating, dysuria, frequency and hematuria.  Musculoskeletal: Negative for back pain, leg pain and joint pain.  Skin: Negative for rash.  Neurological: Negative for dizziness, headaches, light-headedness, numbness and seizures.  Psychiatric: Negative for behavioral problem, confusion, depressed mood and sleep disturbance.        Objective:  Objective   Vitals:   01/25/19 1555  BP: 120/70  Pulse: (!) 103  Weight: 136 lb (61.7 kg)  Height: 4\' 11"  (1.499 m)   Body mass index is 27.47 kg/m.  Physical Exam Vitals signs and nursing note reviewed.  Constitutional:      Appearance: She is well-developed.  HENT:     Head: Normocephalic and atraumatic.  Eyes:     Pupils: Pupils are equal, round, and reactive to light.  Cardiovascular:     Rate and Rhythm: Normal rate and regular rhythm.  Pulmonary:     Effort: Pulmonary effort is normal. No respiratory distress.  Skin:    General: Skin is warm and dry.  Neurological:     Mental Status: She is alert and oriented to person, place, and time.  Psychiatric:        Behavior: Behavior normal.        Thought Content: Thought content normal.        Judgment: Judgment normal.        Assessment/Plan:    33 yo G5P1031 at [redacted]w[redacted]d by today's [place].  Pregnancy location  identified as intrauterine. Discussed treatment of nausea and hyperemesis in pregnancy. She would like to begin therapy with OTC B1, B6, and unisom supplementation. Discussed ginger as well.  Follow up in 3 weeks for bedside US to follow up FHR and intial OB visit  More than 15 minutes were spent face to face with the patient in the room with more than 50% of the time spent providing counseling and discussing the plan of management.   Adelene Idler MD [facility], [facility] 01/25/2019 4:26 PM

## 2019-01-30 ENCOUNTER — Telehealth: Payer: Self-pay

## 2019-01-30 NOTE — Telephone Encounter (Signed)
Pt calling c questions - has really bad h/a, nausea, brown d/c c wiping.  [phone]  Adv e.s. tylenol, caffeine, vitamin B6, unisom (wanted OTC), brown means old.  Pt hadn't had IC within 24hrs of it starting on Friday.  If becomes more like a period to be seen.

## 2019-02-21 ENCOUNTER — Ambulatory Visit (INDEPENDENT_AMBULATORY_CARE_PROVIDER_SITE_OTHER): Payer: 59 | Admitting: Obstetrics and Gynecology

## 2019-02-21 ENCOUNTER — Other Ambulatory Visit: Payer: Self-pay

## 2019-02-21 ENCOUNTER — Other Ambulatory Visit (HOSPITAL_COMMUNITY)
Admission: RE | Admit: 2019-02-21 | Discharge: 2019-02-21 | Disposition: A | Discharge status: Home / Self Care | Payer: Managed Care, Other (non HMO) | Source: Ambulatory Visit | Attending: Obstetrics and Gynecology | Admitting: Obstetrics and Gynecology

## 2019-02-21 ENCOUNTER — Encounter: Payer: Self-pay | Admitting: Obstetrics and Gynecology

## 2019-02-21 VITALS — BP 104/62 | Ht 59.0 in | Wt 138.0 lb

## 2019-02-21 DIAGNOSIS — O09529 Supervision of elderly multigravida, unspecified trimester: Secondary | ICD-10-CM | POA: Insufficient documentation

## 2019-02-21 DIAGNOSIS — O0990 Supervision of high risk pregnancy, unspecified, unspecified trimester: Secondary | ICD-10-CM | POA: Diagnosis not present

## 2019-02-21 DIAGNOSIS — O26891 Other specified pregnancy related conditions, first trimester: Secondary | ICD-10-CM

## 2019-02-21 DIAGNOSIS — R51 Headache: Secondary | ICD-10-CM

## 2019-02-21 DIAGNOSIS — Z113 Encounter for screening for infections with a predominantly sexual mode of transmission: Secondary | ICD-10-CM

## 2019-02-21 DIAGNOSIS — R519 Headache, unspecified: Secondary | ICD-10-CM

## 2019-02-21 DIAGNOSIS — O26899 Other specified pregnancy related conditions, unspecified trimester: Secondary | ICD-10-CM

## 2019-02-21 DIAGNOSIS — Z1379 Encounter for other screening for genetic and chromosomal anomalies: Secondary | ICD-10-CM

## 2019-02-21 DIAGNOSIS — O34219 Maternal care for unspecified type scar from previous cesarean delivery: Secondary | ICD-10-CM | POA: Insufficient documentation

## 2019-02-21 DIAGNOSIS — Z3A09 9 weeks gestation of pregnancy: Secondary | ICD-10-CM

## 2019-02-21 DIAGNOSIS — O219 Vomiting of pregnancy, unspecified: Secondary | ICD-10-CM

## 2019-02-21 DIAGNOSIS — Z3A11 11 weeks gestation of pregnancy: Secondary | ICD-10-CM

## 2019-02-21 MED ORDER — ONDANSETRON 4 MG PO TBDP: 4.0000 mg | ORAL_TABLET | Freq: Four times a day (QID) | ORAL | 3 refills | Status: DC | PRN

## 2019-02-21 MED ORDER — BUTALBITAL-APAP-CAFFEINE 50-325-40 MG PO CAPS: 1.0000 | ORAL_CAPSULE | Freq: Four times a day (QID) | ORAL | 3 refills | Status: DC | PRN

## 2019-02-21 NOTE — Patient Instructions (Signed)
 Hello,  Given the current COVID-19 pandemic, our practice is making changes in how we are providing care to our patients. We are limiting in-person visits for the safety of all of our patients.   As a practice, we have met to discuss the best way to minimize visits, but still provide excellent care to our expecting mothers.  We have decided on the following visit structure for low-risk pregnancies.  Initial Pregnancy visit will be conducted as a telephone or web visit.  Between 10-14 weeks  there will be one in-person visit for an ultrasound, lab work, and genetic screening. 20 weeks in-person visit with an anatomy ultrasound  28 weeks in-person office visit for a 1-hour glucose test and a TDAP vaccination 32 weeks in-person office visit 34 weeks telephone visit 36 weeks in-person office visit for GBS, chlamydia, and gonorrhea testing 38 weeks in-person office visit 40 weeks in-person office visit  Understandably, some patients will require more visits than what is outlined above. Additional visits will be determined on a case-by-case basis.   We will, as always, be available for emergencies or to address concerns that might arise between in-person visits. We ask that you allow us the opportunity to address any concerns over the phone or through a virtual visit first. We will be available to return your phone calls throughout the day.   If you are able to purchase a scale, a blood pressure machine, and a home fetal doppler visits could be limited further. This will help decrease your exposure risks, but these purchases are not a necessity.   Things seem to change daily and there is the possibility that this structure could change, please be patient as we adapt to a new way of caring for patients.   Thank you for trusting us with your prenatal care. Our practice values you and looks forward to providing you with excellent care.   Sincerely,   [facility], [facility]      COVID-19 and Your Pregnancy FAQ  How can I prevent infection with COVID-19 during my pregnancy? Social distancing is key. Please limit any interactions in public. Try and work from home if possible. Frequently wash your hands after touching possibly contaminated surfaces. Avoid touching your face.  Minimize trips to the store. Consider online ordering when possible.   Should I wear a mask? YES. It is recommended by the CDC that all people wear a cloth mask or facial covering in public. This will help reduce transmission as well as your risk or acquiring COVID-19. New studies are showing that even asymptomatic individuals can spread the virus from talking.   What are the symptoms of COVID-19? Fever (greater than 100.4 F), dry cough, shortness of breath.  Am I more at risk for COVID-19 since I am pregnant? There is not currently data showing that pregnant women are more adversely impacted by COVID-19 than the general population. However, we know that pregnant women tend to have worse respiratory complications from similar diseases such as the flu and SARS and for this reason should be considered an at-risk population.  What do I do if I am experiencing the symptoms of COVID-19? Testing is being limited because of test availability. If you are experiencing symptoms you should quarantine yourself, and the members of your family, for at least 2 weeks at home.   Please visit this website for more information: https://www.cdc.gov/coronavirus/2019-ncov/if-you-are-sick/steps-when-sick.html  When should I go to the Emergency Room? Please go to the emergency room if   you are experiencing ANY of these symptoms*:  1.    Difficulty breathing or shortness of breath 2.    Persistent pain or pressure in the chest 3.    Confusion or difficulty being aroused (or awakened) 4.    Bluish lips or face  *This list is not all inclusive. Please consult our office for any other symptoms that are severe  or concerning.  What do I do if I am having difficulty breathing? You should go to the Emergency Room for evaluation. At this time they have a tent set up for evaluating patients with COVID-19 symptoms.   How will my prenatal care be different because of the COVID-19 pandemic? It has been recommended to reduce the frequency of face-to-face visits and use resources such as telephone and virtual visits when possible. Using a scale, blood pressure machine and fetal doppler at home can further help reduce face-to-face visits. You will be provided with additional information on this topic.  We ask that you come to your visits alone to minimize potential exposures to  COVID-19.  How can I receive childbirth education? At this time in-person classes have been cancelled. You can register for online childbirth education, breastfeeding, and newborn care classes.  Please visit:  [email or web address] for more information  How will my hospital birth experience be different? The hospital is currently limiting visitors. This means that while you are in labor you can only have one person at the hospital with you. Additional family members will not be allowed to wait in the building or outside your room. Your one support person can be the father of the baby, a relative, a doula, or a friend. Once one support person is designated that person will wear a band. This band cannot be shared with multiple people.  How long will I stay in the hospital for after giving birth? It is also recommended that discharge home be expedited during the COVID-19 outbreak. This means staying for 1 day after a vaginal delivery and 2 days after a cesarean section.  What if I have COVID-19 and I am in labor? We ask that you wear a mask while on labor and delivery. We will try and accommodate you being placed in a room that is capable of filtering the air. Please call ahead if you are in labor and on your way to the hospital. The  phone number for labor and delivery at [facility] is [phone].  If I have COVID-19 when my baby is born how can I prevent my baby from contracting COVID-19? This is an issue that will have to be discussed on a case-by-case basis. Current recommendations suggest providing separate isolation rooms for both the mother and new infant as well as limiting visitors. However, there are practical challenges to this recommendation. The situation will assuredly change and decisions will be influenced by the desires of the mother and availability of space.  Some suggestions are the use of a curtain or physical barrier between mom and infant, hand hygiene, mom wearing a mask, or 6 feet of spacing between a mom and infant.   Can I breastfeed during the COVID-19 pandemic?   Yes, breastfeeding is encouraged.  Can I breastfeed if I have COVID-19? Yes. Covid-19 has not been found in breast milk. This means you cannot give COVID-19 to your child through breast milk. Breast feeding will also help pass antibodies to fight infection to your baby.   What precautions should I take when breastfeeding   if I have COVID-19? If a mother and newborn do room-in and the mother wishes to feed at the breast, she should put on a facemask and practice hand hygiene before each feeding.  What precautions should I take when pumping if I have COVID-19? Prior to expressing breast milk, mothers should practice hand hygiene. After each pumping session, all parts that come into contact with breast milk should be thoroughly washed and the entire pump should be appropriately disinfected per the manufacturer's instructions. This expressed breast milk should be fed to the newborn by a healthy caregiver.  What if I am pregnant and work in healthcare? Based on limited data regarding COVID-19 and pregnancy, ACOG currently does not propose creating additional restrictions on pregnant health care personnel because of  COVID-19 alone. Pregnant women do not appear to be at higher risk of severe disease related to COVID-19. Pregnant health care personnel should follow CDC risk assessment and infection control guidelines for health care personnel exposed to patients with suspected or confirmed COVID-19. Adherence to recommended infection prevention and control practices is an important part of protecting all health care personnel in health care settings.    Information on COVID-19 in pregnancy is very limited; however, facilities may want to consider limiting exposure of pregnant health care personnel to patients with confirmed or suspected COVID-19 infection, especially during higher-risk procedures (eg, aerosol-generating procedures), if feasible, based on staffing availability.     Nausea: B1 and B6 50-100 mg a day of each Unisom, 1 tablet at night Zofran as needed. Can cause constipation. Be sure to drink a lot of water if you take it.  Headache: Tylenol 534m  2 tablets (1,00100m every 6 hours as needed If doesn't help take Fioricet

## 2019-02-21 NOTE — Progress Notes (Signed)
OB C/o nausea and vomiting, and headaches

## 2019-02-21 NOTE — Progress Notes (Signed)
02/21/2019   Chief Complaint: Missed period  Transfer of Care Patient: no  History of Present Illness: Destiny Allen is a 33 y.o. [id number] [redacted]w[redacted]d based on Patient's last menstrual period was 12/05/2018 (exact date). with an Estimated Date of Delivery: 09/22/19, with the above CC.   She has been having nausea and headaches   Her periods were: irregular She was using no method when she conceived.  She has Positive signs or symptoms of nausea/vomiting of pregnancy. She has Negative signs or symptoms of miscarriage or preterm labor She was not taking different medications around the time she conceived/early pregnancy. Since her LMP, she has not used alcohol Since her LMP, she has not used tobacco products Since her LMP, she has not used illegal drugs.    She claims she has gained weight since the start of her pregnancy.    Infection History:  1. Since her LMP, she has not had a viral illness.  2. She admits to close contact with children on a regular basis  : her son who is 9  3. She admits to a history of chicken pox, or vaccination for chicken pox in the past. 4. Patient or partner has history of genital herpes  no 5. History of STI (GC, CT, HPV, syphilis, HIV)  no   6.  She does not live with someone with TB or TB exposed. 7. History of recent travel :  no 8. She identifies Negative Zika risk factors for her and her partner 30. There are cats in the home in the home  No. She understands that while pregnant she should not change cat litter.   Genetic Screening Questions: (Includes patient, baby's father, or anyone in either family)   1. Patient's age >/= 12 at [facility]  yes 2. Thalassemia ([place], [place], Mediterranean, or Asian background): MCV<80  no 3. Neural tube defect (meningomyelocele, spina bifida, anencephaly)  no 4. Congenital heart defect  no  5. Down syndrome  no 6. Tay-Sachs (Jewish, [place]  no 7. Canavan's Disease  no 8. Sickle cell disease or trait (African)  no   9. Hemophilia or other blood disorders  no  10. Muscular dystrophy  no  11. Cystic fibrosis  no  12. Huntington's Chorea  no  13. Mental retardation/autism  no 14. Other inherited genetic or chromosomal disorder  no 15. Maternal metabolic disorder (DM, PKU, etc)  no 16. Patient or FOB with a child with a birth defect not listed above no  16a. Patient or FOB with a birth defect themselves no 17. Recurrent pregnancy loss, or stillbirth  no  18. Any medications since LMP other than prenatal vitamins (include vitamins, supplements, OTC meds, drugs, alcohol)  no 19. Any other genetic/environmental exposure to discuss  no  ROS:  Review of Systems  Constitutional: Negative for chills, fever, malaise/fatigue and weight loss.  HENT: Negative for congestion, hearing loss and sinus pain.   Eyes: Negative for blurred vision and double vision.  Respiratory: Negative for cough, sputum production, shortness of breath and wheezing.   Cardiovascular: Negative for chest pain, palpitations, orthopnea and leg swelling.  Gastrointestinal: Negative for abdominal pain, constipation, diarrhea, nausea and vomiting.  Genitourinary: Negative for dysuria, flank pain, frequency, hematuria and urgency.  Musculoskeletal: Negative for back pain, falls and joint pain.  Skin: Negative for itching and rash.  Neurological: Negative for dizziness and headaches.  Psychiatric/Behavioral: Negative for depression, substance abuse and suicidal ideas. The patient is not nervous/anxious.     OBGYN History:  As per HPI. OB History  Gravida Para Term Preterm AB Living  5 1 1   3 1   SAB TAB Ectopic Multiple Live Births  0   2   1    # Outcome Date GA Lbr Len/2nd Weight Sex Delivery Anes PTL Lv  5 Current           4 Molar 11/12/17             Complications: Partial molar pregnancy  3 Term 2011 [id number]  7 lb (3.175 kg) M CS-Unspec   LIV  2 Ectopic           1 Ectopic             Any issues with any prior pregnancies: no  Any prior children are healthy, doing well, without any problems or issues: yes History of pap smears: Yes. Last pap smear 2018.  History of STIs: No   Past Medical History: Past Medical History:  Diagnosis Date  . Infertility associated with anovulation   . Ovarian cyst     Past Surgical History: Past Surgical History:  Procedure Laterality Date  . CESAREAN SECTION  2011  . DILATION AND EVACUATION N/A 11/12/2017   Procedure: DILATATION AND EVACUATION;  Surgeon: Destiny Allen, Andreas, MD;  Location: [facility] ORS;  Service: Gynecology;  Laterality: N/A;    Family History:  Family History  Problem Relation Age of Onset  . Hypertension Father    She denies any female cancers, bleeding or blood clotting disorders.  She denies any history of mental retardation, birth defects or genetic disorders in her or the FOB's history  Social History:  Social History   Socioeconomic History  . Marital status: Married    Spouse name: Not on file  . Number of children: Not on file  . Years of education: Not on file  . Highest education level: Not on file  Occupational History  . Not on file  Social Needs  . Financial resource strain: Not on file  . Food insecurity:    Worry: Not on file    Inability: Not on file  . Transportation needs:    Medical: Not on file    Non-medical: Not on file  Tobacco Use  . Smoking status: Never Smoker  . Smokeless tobacco: Never Used  Substance and Sexual Activity  . Alcohol use: No  . Drug use: No  . Sexual activity: Yes    Birth control/protection: None  Lifestyle  . Physical activity:    Days per week: Not on file    Minutes per session: Not on file  . Stress: Not on file  Relationships  . Social connections:    Talks on phone: Not on file    Gets together: Not on file    Attends religious service: Not on file    Active member of club or organization: Not on file    Attends meetings of clubs or organizations: Not on file    Relationship  status: Not on file  . Intimate partner violence:    Fear of current or ex partner: Not on file    Emotionally abused: Not on file    Physically abused: Not on file    Forced sexual activity: Not on file  Other Topics Concern  . Not on file  Social History Narrative  . Not on file    Allergy: No Known Allergies  Current Outpatient Medications:  Current Outpatient Medications:  .  PRENATAL 28-0.8 MG TABS, Take  by mouth., Disp: , Rfl:  .  docusate sodium (COLACE) 100 MG capsule, Take 1 capsule (100 mg total) by mouth 2 (two) times daily as needed. (Patient not taking: Reported on 02/21/2019), Disp: 30 capsule, Rfl: 2   Physical Exam:  Physical Exam  Nursing note and vitals reviewed. Constitutional: She is oriented to person, place, and time. She appears well-developed and well-nourished.  HENT:  Head: Normocephalic and atraumatic.  Cardiovascular: Normal rate and regular rhythm.  Respiratory: Effort normal and breath sounds normal.  GI: Soft. Bowel sounds are normal.  Musculoskeletal: Normal range of motion.  Neurological: She is alert and oriented to person, place, and time.  Skin: Skin is warm and dry.  Psychiatric: She has a normal mood and affect. Her behavior is normal. Judgment and thought content normal.       Assessment: Ms. Destiny Allen is a 33 y.o. [id number] [redacted]w[redacted]d based on Patient's last menstrual period was 12/05/2018 (exact date). with an Estimated Date of Delivery: 09/22/19,  for prenatal care.  Plan:  1) Avoid alcoholic beverages. 2) Patient encouraged not to smoke.  3) Discontinue the use of all non-medicinal drugs and chemicals.  4) Take prenatal vitamins daily.  5) Seatbelt use advised 6) Nutrition, food safety (fish, cheese advisories, and high nitrite foods) and exercise discussed. 7) Hospital and practice style delivering at [facility] discussed  8) Patient is asked about travel to areas at risk for the Zika virus, and counseled to avoid travel and exposure to  mosquitoes or sexual partners who may have themselves been exposed to the virus. Testing is discussed, and will be ordered as appropriate.  9) Childbirth classes at [facility] advised 10) Genetic Screening, such as with 1st Trimester Screening, cell free fetal DNA, AFP testing, and Ultrasound, as well as with amniocentesis and CVS as appropriate, is discussed with patient. She plans to have genetic testing this pregnancy.   Will return in 2 weeks so Maternit21 testing can be drawn. Discussed COVID-19 precautions FHR 171 bpm on bedside US today NOB panel and Urine GC/CT today Tylenol/fiorcet for headache B1, B6, unisom and zofran for nausea.  Problem list reviewed and updated.  I discussed the assessment and treatment plan with the patient. The patient was provided an opportunity to ask questions and all were answered. The patient agreed with the plan and demonstrated an understanding of the instructions.   The patient was advised to call back or seek an in-person evaluation if the symptoms worsen or if the condition fails to improve as anticipated.  Adelene Idler MD [facility], [facility] 02/21/2019 4:49 PM

## 2019-02-22 LAB — RPR+RH+ABO+RUB AB+AB SCR+CB...
Antibody Screen: NEGATIVE
HIV Screen 4th Generation wRfx: NONREACTIVE
Hematocrit: 36.3 % (ref 34.0–46.6)
Hemoglobin: 12.5 g/dL (ref 11.1–15.9)
Hepatitis B Surface Ag: NEGATIVE
MCH: 30.5 pg (ref 26.6–33.0)
MCHC: 34.4 g/dL (ref 31.5–35.7)
MCV: 89 fL (ref 79–97)
Platelets: 273 10*3/uL (ref 150–450)
RBC: 4.1 x10E6/uL (ref 3.77–5.28)
RDW: 13.3 % (ref 11.7–15.4)
RPR Ser Ql: NONREACTIVE
Rh Factor: POSITIVE
Rubella Antibodies, IGG: 24.5 index (ref >0.99)
Varicella zoster IgG: 3230 index (ref >165)
WBC: 12.1 10*3/uL — ABNORMAL HIGH (ref 3.4–10.8)

## 2019-02-22 NOTE — Progress Notes (Signed)
Please call and let patient know prenatal labs were normal. Thank you

## 2019-02-23 LAB — URINE CYTOLOGY ANCILLARY ONLY
Chlamydia: NEGATIVE
Neisseria Gonorrhea: NEGATIVE

## 2019-03-01 NOTE — Progress Notes (Signed)
Called and discussed with patient. She also said she would like to not have Maternit21 testing

## 2019-03-09 ENCOUNTER — Other Ambulatory Visit: Payer: 59

## 2019-03-09 ENCOUNTER — Encounter: Payer: 59 | Admitting: Obstetrics and Gynecology

## 2019-03-17 ENCOUNTER — Encounter: Payer: Self-pay | Admitting: Nurse Practitioner

## 2019-04-05 ENCOUNTER — Ambulatory Visit (INDEPENDENT_AMBULATORY_CARE_PROVIDER_SITE_OTHER): Payer: 59 | Admitting: Obstetrics and Gynecology

## 2019-04-05 ENCOUNTER — Encounter: Payer: Self-pay | Admitting: Obstetrics and Gynecology

## 2019-04-05 ENCOUNTER — Other Ambulatory Visit: Payer: Self-pay

## 2019-04-05 DIAGNOSIS — O0990 Supervision of high risk pregnancy, unspecified, unspecified trimester: Secondary | ICD-10-CM

## 2019-04-05 DIAGNOSIS — O34219 Maternal care for unspecified type scar from previous cesarean delivery: Secondary | ICD-10-CM

## 2019-04-05 DIAGNOSIS — Z3A15 15 weeks gestation of pregnancy: Secondary | ICD-10-CM

## 2019-04-05 DIAGNOSIS — O09529 Supervision of elderly multigravida, unspecified trimester: Secondary | ICD-10-CM

## 2019-04-05 NOTE — Progress Notes (Signed)
Routine Prenatal Care Visit- Virtual Visit  Subjective   Virtual Visit via Telephone Note  I connected with Destiny Allen on 04/05/19 at 11:30 AM EDT by telephone and verified that I am speaking with the correct person using two identifiers.   I discussed the limitations, risks, security and privacy concerns of performing an evaluation and management service by telephone and the availability of in person appointments. I also discussed with the patient that there may be a patient responsible charge related to this service. The patient expressed understanding and agreed to proceed.  The patient was at home I spoke with the patient from my  office The names of people involved in this encounter were: Schuyler Amor and Dr. Jerene Pitch.   Destiny Allen is a 33 y.o. [phone] at [redacted]w[redacted]d being seen today for ongoing prenatal care.  She is currently monitored for the following issues for this high-risk pregnancy and has History of ectopic pregnancy; Partial molar pregnancy; Encounter for general adult medical examination with abnormal findings; Iron deficiency anemia; Mixed hyperlipidemia; Vitamin D deficiency; Impaired fasting glucose; Supervision of high risk pregnancy, antepartum; History of cesarean delivery affecting pregnancy; and Antepartum multigravida of advanced maternal age on their problem list.  ----------------------------------------------------------------------------------- Patient reports no complaints.  Reports that her nausea has improved. Reports that her headaches have resolved. She has perceived minor flutters and movements.  Contractions: Not present. Vag. Bleeding: None.  Movement: Present. Denies leaking of fluid.  ----------------------------------------------------------------------------------- The following portions of the patient's history were reviewed and updated as appropriate: allergies, current medications, past family history, past medical history, past social history,  past surgical history and problem list. Problem list updated.   Objective  Last menstrual period 12/05/2018, unknown if currently breastfeeding. Pregravid weight 136 lb (61.7 kg) Total Weight Gain 2 lb (0.907 kg) Urinalysis:      Fetal Status:     Movement: Present     Physical Exam could not be performed. Because of the COVID-19 outbreak this visit was performed over the phone and not in person.   Assessment   33 y.o. [id number] at [redacted]w[redacted]d by  09/22/2019, by Ultrasound presenting for routine prenatal visit  Plan   Pregnancy #4 Problems (from 12/05/18 to present)    Problem Noted Resolved   Supervision of high risk pregnancy, antepartum 02/21/2019 by Natale Milch, MD No   Overview Addendum 04/05/2019 11:50 AM by Natale Milch, MD      Clinic [facility] Prenatal Labs  Dating  5 wk [place] Blood type: A/Positive/-- (04/07 1636)   Genetic Screen Declined    Antibody:Negative (04/07 1636)  Anatomic [place]  Rubella: 24.50 (04/07 1636)  Varicella: Immune  GTT Early: 71  28 wk:      RPR: Non Reactive (04/07 1636)   Rhogam  not needed HBsAg: Negative (04/07 1636)   TDaP vaccine                       HIV: Non Reactive (04/07 1636)   Flu Shot                                GBS:   Contraception  Pap: NIL 2018  CBB     CS/VBAC  Hx of cesarean section   Baby Food    Support Person             History of cesarean delivery affecting pregnancy 02/21/2019  by Natale Milch,  R, MD No   Antepartum multigravida of advanced maternal age 35/05/2019 by Natale Milch,  R, MD No       Gestational age appropriate obstetric precautions including but not limited to vaginal bleeding, contractions, leaking of fluid and fetal movement were reviewed in detail with the patient.    Doing well.  Continue with prenatal care.  Follow Up Instructions:   Follow up in 4 weeks for anatomy US  I discussed the assessment and treatment plan with the patient. The patient was provided an opportunity to  ask questions and all were answered. The patient agreed with the plan and demonstrated an understanding of the instructions.   The patient was advised to call back or seek an in-person evaluation if the symptoms worsen or if the condition fails to improve as anticipated.  I provided 11 minutes of non-face-to-face time during this encounter.  Return in about 4 weeks (around 05/03/2019) for ROB and US.  Adelene Idlerhristanna  MD [facility], [facility] 04/05/2019 1:06 PM

## 2019-04-05 NOTE — Progress Notes (Signed)
ROB/Telephone visit No concerns  Denies lof, no vb, felt a flutter

## 2019-05-04 ENCOUNTER — Other Ambulatory Visit: Payer: Self-pay

## 2019-05-04 ENCOUNTER — Ambulatory Visit (INDEPENDENT_AMBULATORY_CARE_PROVIDER_SITE_OTHER): Payer: 59

## 2019-05-04 ENCOUNTER — Ambulatory Visit (INDEPENDENT_AMBULATORY_CARE_PROVIDER_SITE_OTHER): Payer: 59 | Admitting: Obstetrics and Gynecology

## 2019-05-04 ENCOUNTER — Encounter: Payer: Self-pay | Admitting: Obstetrics and Gynecology

## 2019-05-04 VITALS — BP 120/70 | Wt 152.0 lb

## 2019-05-04 DIAGNOSIS — O09522 Supervision of elderly multigravida, second trimester: Secondary | ICD-10-CM

## 2019-05-04 DIAGNOSIS — O34219 Maternal care for unspecified type scar from previous cesarean delivery: Secondary | ICD-10-CM

## 2019-05-04 DIAGNOSIS — Z363 Encounter for antenatal screening for malformations: Secondary | ICD-10-CM | POA: Diagnosis not present

## 2019-05-04 DIAGNOSIS — Z3A20 20 weeks gestation of pregnancy: Secondary | ICD-10-CM

## 2019-05-04 DIAGNOSIS — O09529 Supervision of elderly multigravida, unspecified trimester: Secondary | ICD-10-CM

## 2019-05-04 DIAGNOSIS — O0992 Supervision of high risk pregnancy, unspecified, second trimester: Secondary | ICD-10-CM

## 2019-05-04 DIAGNOSIS — O0990 Supervision of high risk pregnancy, unspecified, unspecified trimester: Secondary | ICD-10-CM

## 2019-05-04 LAB — POCT URINALYSIS DIPSTICK OB
Glucose, UA: NEGATIVE
POC,PROTEIN,UA: NEGATIVE

## 2019-05-04 NOTE — Progress Notes (Signed)
ROB/US Doesn't want to know gender C/o feels like she is going to pass out in the mornings, and numbness in right arm  Denies lof, no vb, Good FM

## 2019-05-04 NOTE — Progress Notes (Signed)
Routine Prenatal Care Visit  Subjective  Destiny Allen is a 33 y.o. [phone] at 4041w6d being seen today for ongoing prenatal care.  She is currently monitored for the following issues for this high-risk pregnancy and has History of ectopic pregnancy; Partial molar pregnancy; Encounter for general adult medical examination with abnormal findings; Iron deficiency anemia; Mixed hyperlipidemia; Vitamin D deficiency; Impaired fasting glucose; Supervision of high risk pregnancy, antepartum; History of cesarean delivery affecting pregnancy; and Antepartum multigravida of advanced maternal age on their problem list.  ----------------------------------------------------------------------------------- Patient reports numbness for 5 minutes a day in hand. not sure if it is a specific finger. Drops objects more often. Dizziness in morning when brushing teeth or after breakfast..   Contractions: Not present. Vag. Bleeding: None.  Movement: Present. Denies leaking of fluid.  ----------------------------------------------------------------------------------- The following portions of the patient's history were reviewed and updated as appropriate: allergies, current medications, past family history, past medical history, past social history, past surgical history and problem list. Problem list updated.   Objective  Blood pressure 120/70, weight 152 lb (68.9 kg), last menstrual period 12/05/2018, unknown if currently breastfeeding. Pregravid weight 136 lb (61.7 kg) Total Weight Gain 16 lb (7.258 kg) Urinalysis:      Fetal Status: Fetal Heart Rate (bpm): 151   Movement: Present     General:  Alert, oriented and cooperative. Patient is in no acute distress.  Skin: Skin is warm and dry. No rash noted.   Cardiovascular: Normal heart rate noted  Respiratory: Normal respiratory effort, no problems with respiration noted  Abdomen: Soft, gravid, appropriate for gestational age. Pain/Pressure: Absent     Pelvic:   Cervical exam deferred        Extremities: Normal range of motion.  Edema: None  Mental Status: Normal mood and affect. Normal behavior. Normal judgment and thought content.     Assessment   33 y.o. [id number] at 7041w6d by  09/22/2019, by Ultrasound presenting for routine prenatal visit  Plan   Pregnancy #4 Problems (from 12/05/18 to present)    Problem Noted Resolved   Supervision of high risk pregnancy, antepartum 02/21/2019 by Natale MilchSchuman, Lance Huaracha R, MD No   Overview Addendum 04/05/2019 11:50 AM by Natale MilchSchuman, Remiel Corti R, MD      Clinic [facility] Prenatal [facility]  Dating  5 wk US Blood type: A/Positive/-- (04/07 1636)   Genetic Screen Declined    Antibody:Negative (04/07 1636)  Anatomic US  Rubella: 24.50 (04/07 1636)  Varicella: Immune  GTT Early: 71  28 wk:      RPR: Non Reactive (04/07 1636)   Rhogam  not needed HBsAg: Negative (04/07 1636)   TDaP vaccine                       HIV: Non Reactive (04/07 1636)   Flu Shot                                GBS:   Contraception  Pap: NIL 2018  CBB     CS/VBAC  Hx of cesarean section   Baby Food    Support Person             History of cesarean delivery affecting pregnancy 02/21/2019 by Natale MilchSchuman, Pearly Apachito R, MD No   Antepartum multigravida of advanced maternal age 36/05/2019 by Natale MilchSchuman, Maika Kaczmarek R, MD No       Gestational age appropriate obstetric precautions including but  not limited to vaginal bleeding, contractions, leaking of fluid and fetal movement were reviewed in detail with the patient.    Discussed sleeping with wrist braces for hand tingling and numbness Uncertain of source of dizziness, discussed increasing water consumption in the morning as this might help.  Finger hgb 10.9  Finger glucose 106  (last ate 2pm)   Return in about 4 weeks (around 06/01/2019) for ROB in person.  Homero Fellers MD [facility] 05/04/2019, 5:31 PM

## 2019-05-04 NOTE — Patient Instructions (Signed)
Ferrous sulfate 325 mg (65 mg elemental) B12- 1043mcg a day   Iron-Rich Diet  Iron is a mineral that helps your body to produce hemoglobin. Hemoglobin is a protein in red blood cells that carries oxygen to your body's tissues. Eating too little iron may cause you to feel weak and tired, and it can increase your risk of infection. Iron is naturally found in many foods, and many foods have iron added to them (iron-fortified foods). You may need to follow an iron-rich diet if you do not have enough iron in your body due to certain medical conditions. The amount of iron that you need each day depends on your age, your sex, and any medical conditions you have. Follow instructions from your health care provider or a diet and nutrition specialist (dietitian) about how much iron you should eat each day. What are tips for following this plan? Reading food labels  Check food labels to see how many milligrams (mg) of iron are in each serving. Cooking  Cook foods in pots and pans that are made from iron.  Take these steps to make it easier for your body to absorb iron from certain foods: ? Soak beans overnight before cooking. ? Soak whole grains overnight and drain them before using. ? Ferment flours before baking, such as by using yeast in bread dough. Meal planning  When you eat foods that contain iron, you should eat them with foods that are high in vitamin C. These include oranges, peppers, tomatoes, potatoes, and mango. Vitamin C helps your body to absorb iron. General information  Take iron supplements only as told by your health care provider. An overdose of iron can be life-threatening. If you were prescribed iron supplements, take them with orange juice or a vitamin C supplement.  When you eat iron-fortified foods or take an iron supplement, you should also eat foods that naturally contain iron, such as meat, poultry, and fish. Eating naturally iron-rich foods helps your body to absorb the  iron that is added to other foods or contained in a supplement.  Certain foods and drinks prevent your body from absorbing iron properly. Avoid eating these foods in the same meal as iron-rich foods or with iron supplements. These foods include: ? Coffee, black tea, and red wine. ? Milk, dairy products, and foods that are high in calcium. ? Beans and soybeans. ? Whole grains. What foods should I eat? Fruits Prunes. Raisins. Eat fruits high in vitamin C, such as oranges, grapefruits, and strawberries, alongside iron-rich foods. Vegetables Spinach (cooked). Green peas. Broccoli. Fermented vegetables. Eat vegetables high in vitamin C, such as leafy greens, potatoes, bell peppers, and tomatoes, alongside iron-rich foods. Grains Iron-fortified breakfast cereal. Iron-fortified whole-wheat bread. Enriched rice. Sprouted grains. Meats and other proteins Beef liver. Oysters. Beef. Shrimp. [place]. Chicken. [place]. Sardines. Chickpeas. Nuts. Tofu. Pumpkin seeds. Beverages Tomato juice. Fresh orange juice. Prune juice. Hibiscus tea. Fortified instant breakfast shakes. Sweets and desserts Blackstrap molasses. Seasonings and condiments Tahini. Fermented soy sauce. Other foods Wheat germ. The items listed above may not be a complete list of recommended foods and beverages. Contact a dietitian for more information. What foods should I avoid? Grains Whole grains. Bran cereal. Bran flour. Oats. Meats and other proteins Soybeans. Products made from soy protein. Black beans. Lentils. Mung beans. Split peas. Dairy Milk. Cream. Cheese. Yogurt. Cottage cheese. Beverages Coffee. Black tea. Red wine. Sweets and desserts Cocoa. Chocolate. Ice cream. Other foods Basil. Oregano. Large amounts of parsley. The items listed  above may not be a complete list of foods and beverages to avoid. Contact a dietitian for more information. Summary  Iron is a mineral that helps your body to produce hemoglobin.  Hemoglobin is a protein in red blood cells that carries oxygen to your body's tissues.  Iron is naturally found in many foods, and many foods have iron added to them (iron-fortified foods).  When you eat foods that contain iron, you should eat them with foods that are high in vitamin C. Vitamin C helps your body to absorb iron.  Certain foods and drinks prevent your body from absorbing iron properly, such as whole grains and dairy products. You should avoid eating these foods in the same meal as iron-rich foods or with iron supplements. This information is not intended to replace advice given to you by your health care provider. Make sure you discuss any questions you have with your health care provider. Document Released: 06/16/2005 Document Revised: 09/28/2017 Document Reviewed: 09/28/2017 Elsevier Interactive Patient Education  2019 [employer]

## 2019-05-31 ENCOUNTER — Encounter: Payer: 59 | Admitting: Obstetrics and Gynecology

## 2019-06-02 ENCOUNTER — Other Ambulatory Visit: Payer: Self-pay

## 2019-06-02 ENCOUNTER — Encounter: Payer: Self-pay | Admitting: Maternal Newborn

## 2019-06-02 ENCOUNTER — Ambulatory Visit (INDEPENDENT_AMBULATORY_CARE_PROVIDER_SITE_OTHER): Payer: 59 | Admitting: Maternal Newborn

## 2019-06-02 VITALS — BP 122/78 | Wt 161.0 lb

## 2019-06-02 DIAGNOSIS — O34219 Maternal care for unspecified type scar from previous cesarean delivery: Secondary | ICD-10-CM

## 2019-06-02 DIAGNOSIS — Z3A24 24 weeks gestation of pregnancy: Secondary | ICD-10-CM

## 2019-06-02 DIAGNOSIS — O09522 Supervision of elderly multigravida, second trimester: Secondary | ICD-10-CM

## 2019-06-02 DIAGNOSIS — O0990 Supervision of high risk pregnancy, unspecified, unspecified trimester: Secondary | ICD-10-CM

## 2019-06-02 DIAGNOSIS — O09292 Supervision of pregnancy with other poor reproductive or obstetric history, second trimester: Secondary | ICD-10-CM

## 2019-06-02 DIAGNOSIS — Z3689 Encounter for other specified antenatal screening: Secondary | ICD-10-CM

## 2019-06-02 NOTE — Patient Instructions (Signed)

## 2019-06-02 NOTE — Progress Notes (Signed)
Routine Prenatal Care Visit  Subjective  Destiny Allen is a 33 y.o. [phone] at 4664w0d being seen today for ongoing prenatal care.  She is currently monitored for the following issues for this high-risk pregnancy and has History of ectopic pregnancy; Partial molar pregnancy; Encounter for general adult medical examination with abnormal findings; Iron deficiency anemia; Mixed hyperlipidemia; Vitamin D deficiency; Impaired fasting glucose; Supervision of high risk pregnancy, antepartum; History of cesarean delivery affecting pregnancy; and Antepartum multigravida of advanced maternal age on their problem list.  ----------------------------------------------------------------------------------- Patient reports carpal tunnel symptoms at night. Morning dizziness persists and is happening once or twice a week.   Contractions: Not present. Vag. Bleeding: None.  Movement: Present. No leaking of fluid.  ----------------------------------------------------------------------------------- The following portions of the patient's history were reviewed and updated as appropriate: allergies, current medications, past family history, past medical history, past social history, past surgical history and problem list. Problem list updated.   Objective  Blood pressure 122/78, weight 161 lb (73 kg), last menstrual period 12/05/2018, unknown if currently breastfeeding. Pregravid weight 136 lb (61.7 kg) Total Weight Gain 25 lb (11.3 kg)  Fetal Status: Fetal Heart Rate (bpm): 146 Fundal Height: 27 cm Movement: Present     General:  Alert, oriented and cooperative. Patient is in no acute distress.  Skin: Skin is warm and dry. No rash noted.   Cardiovascular: Normal heart rate noted  Respiratory: Normal respiratory effort, no problems with respiration noted  Abdomen: Soft, gravid, appropriate for gestational age. Pain/Pressure: Absent     Pelvic:  Cervical exam deferred        Extremities: Normal range of motion.   Edema: None  Mental Status: Normal mood and affect. Normal behavior. Normal judgment and thought content.     Assessment   33 y.o. [id number] at 7564w0d, EDD 09/22/2019 by Ultrasound presenting for a routine prenatal visit.  Plan   Pregnancy #4 Problems (from 12/05/18 to present)    Problem Noted Resolved   Supervision of high risk pregnancy, antepartum 02/21/2019 by Natale MilchSchuman, Christanna R, MD No   Overview Addendum 04/05/2019 11:50 AM by Natale MilchSchuman, Christanna R, MD      Clinic [facility]renatal [facility]  Dating  5 wk US Blood type: A/Positive/-- (04/07 1636)   Genetic Screen Declined    Antibody:Negative (04/07 1636)  Anatomic US  Rubella: 24.50 (04/07 1636)  Varicella: Immune  GTT Early: 71  28 wk:      RPR: Non Reactive (04/07 1636)   Rhogam  not needed HBsAg: Negative (04/07 1636)   TDaP vaccine                       HIV: Non Reactive (04/07 1636)   Flu Shot                                GBS:   Contraception  Pap: NIL 2018  CBB     CS/VBAC  Hx of cesarean section   Baby Food    Support Person             History of cesarean delivery affecting pregnancy 02/21/2019 by Natale MilchSchuman, Christanna R, MD No   Antepartum multigravida of advanced maternal age 45/05/2019 by Natale MilchSchuman, Christanna R, MD No      Growth scan next time for fundal height greater than dates.  Discussed using wrist braces while asleep to help with carpal tunnel symptoms.  Advised increasing hydration and changing positions slowly to help with dizziness.  Please refer to After Visit Summary for other counseling recommendations.   Return in about 4 weeks (around 06/30/2019) for ROB and GTT/labs with growth scan.  Avel Sensor, CNM 06/02/2019  4:39 PM

## 2019-06-02 NOTE — Progress Notes (Signed)
No vb. No lof. Pt having some swelling and numbess in hands

## 2019-06-27 ENCOUNTER — Other Ambulatory Visit: Payer: 59

## 2019-06-27 ENCOUNTER — Encounter: Payer: 59 | Admitting: Obstetrics and Gynecology

## 2019-06-28 ENCOUNTER — Other Ambulatory Visit: Payer: Self-pay

## 2019-06-28 ENCOUNTER — Encounter: Payer: Self-pay | Admitting: Maternal Newborn

## 2019-06-28 ENCOUNTER — Ambulatory Visit (INDEPENDENT_AMBULATORY_CARE_PROVIDER_SITE_OTHER): Payer: 59

## 2019-06-28 ENCOUNTER — Ambulatory Visit (INDEPENDENT_AMBULATORY_CARE_PROVIDER_SITE_OTHER): Payer: 59 | Admitting: Maternal Newborn

## 2019-06-28 ENCOUNTER — Other Ambulatory Visit: Payer: 59

## 2019-06-28 VITALS — BP 100/60 | Wt 164.0 lb

## 2019-06-28 DIAGNOSIS — O0990 Supervision of high risk pregnancy, unspecified, unspecified trimester: Secondary | ICD-10-CM | POA: Diagnosis not present

## 2019-06-28 DIAGNOSIS — Z3689 Encounter for other specified antenatal screening: Secondary | ICD-10-CM

## 2019-06-28 DIAGNOSIS — Z3A27 27 weeks gestation of pregnancy: Secondary | ICD-10-CM

## 2019-06-28 DIAGNOSIS — Z362 Encounter for other antenatal screening follow-up: Secondary | ICD-10-CM | POA: Diagnosis not present

## 2019-06-28 DIAGNOSIS — O09522 Supervision of elderly multigravida, second trimester: Secondary | ICD-10-CM

## 2019-06-28 DIAGNOSIS — O34219 Maternal care for unspecified type scar from previous cesarean delivery: Secondary | ICD-10-CM

## 2019-06-28 NOTE — Progress Notes (Signed)
Routine Prenatal Care Visit  Subjective  Destiny Allen is a 33 y.o. [phone] at 1747w5d being seen today for ongoing prenatal care.  She is currently monitored for the following issues for this high-risk pregnancy and has History of ectopic pregnancy; Partial molar pregnancy; Iron deficiency anemia; Mixed hyperlipidemia; Vitamin D deficiency; Impaired fasting glucose; Supervision of high risk pregnancy, antepartum; History of cesarean delivery affecting pregnancy; and Antepartum multigravida of advanced maternal age on their problem list.  ----------------------------------------------------------------------------------- Patient reports no complaints.   Contractions: Not present. Vag. Bleeding: None.  Movement: Present. No leaking of fluid.  ----------------------------------------------------------------------------------- The following portions of the patient's history were reviewed and updated as appropriate: allergies, current medications, past family history, past medical history, past social history, past surgical history and problem list. Problem list updated.   Objective  Blood pressure 100/60, weight 164 lb (74.4 kg), last menstrual period 12/05/2018 Pregravid weight 136 lb (61.7 kg) Total Weight Gain 28 lb (12.7 kg)  Fetal Status: Fetal Heart Rate (bpm): 140 (US)   Movement: Present  Presentation: Transverse  General:  Alert, oriented and cooperative. Patient is in no acute distress.  Skin: Skin is warm and dry. No rash noted.   Cardiovascular: Normal heart rate noted  Respiratory: Normal respiratory effort, no problems with respiration noted  Abdomen: Soft, gravid, appropriate for gestational age. Pain/Pressure: Present     Pelvic:  Cervical exam deferred        Extremities: Normal range of motion.  Edema: None  Mental Status: Normal mood and affect. Normal behavior. Normal judgment and thought content.     Assessment   33 y.o. [id number] at 4147w5d, EDD 09/22/2019, by  Ultrasound presenting for a routine prenatal visit.  Plan   Pregnancy #4 Problems (from 12/05/18 to present)    Problem Noted Resolved   Supervision of high risk pregnancy, antepartum 02/21/2019 by Natale MilchSchuman, Christanna R, MD No   Overview Addendum 04/05/2019 11:50 AM by Natale MilchSchuman, Christanna R, MD      Clinic [facility] Prenatal [facility]  Dating  5 wk US Blood type: A/Positive/-- (04/07 1636)   Genetic Screen Declined    Antibody:Negative (04/07 1636)  Anatomic US  Rubella: 24.50 (04/07 1636)  Varicella: Immune  GTT Early: 71  28 wk:      RPR: Non Reactive (04/07 1636)   Rhogam  not needed HBsAg: Negative (04/07 1636)   TDaP vaccine                       HIV: Non Reactive (04/07 1636)   Flu Shot                                GBS:   Contraception  Pap: NIL 2018  CBB     CS/VBAC  Hx of cesarean section   Baby Food    Support Person             History of cesarean delivery affecting pregnancy 02/21/2019 by Natale MilchSchuman, Christanna R, MD No   Antepartum multigravida of advanced maternal age 51/05/2019 by Natale MilchSchuman, Christanna R, MD No      Growth scan today shows growth at 79.4%, EFW 3 lb 2 oz,  AFI 11.6 cm, FHR 140 bpm, transverse presentation. Results reviewed with patient.  Please refer to After Visit Summary for other counseling recommendations.   Return in about 2 weeks (around 07/12/2019) for ROB.  Marcelyn BruinsJacelyn , CNM 06/28/2019  12:05 PM

## 2019-06-28 NOTE — Patient Instructions (Signed)
Third Trimester of Pregnancy The third trimester is from week 28 through week 40 (months 7 through 9). The third trimester is a time when the unborn baby (fetus) is growing rapidly. At the end of the ninth month, the fetus is about 20 inches in length and weighs 6-10 pounds. Body changes during your third trimester Your body will continue to go through many changes during pregnancy. The changes vary from woman to woman. During the third trimester:  Your weight will continue to increase. You can expect to gain 25-35 pounds (11-16 kg) by the end of the pregnancy.  You may begin to get stretch marks on your hips, abdomen, and breasts.  You may urinate more often because the fetus is moving lower into your pelvis and pressing on your bladder.  You may develop or continue to have heartburn. This is caused by increased hormones that slow down muscles in the digestive tract.  You may develop or continue to have constipation because increased hormones slow digestion and cause the muscles that push waste through your intestines to relax.  You may develop hemorrhoids. These are swollen veins (varicose veins) in the rectum that can itch or be painful.  You may develop swollen, bulging veins (varicose veins) in your legs.  You may have increased body aches in the pelvis, back, or thighs. This is due to weight gain and increased hormones that are relaxing your joints.  You may have changes in your hair. These can include thickening of your hair, rapid growth, and changes in texture. Some women also have hair loss during or after pregnancy, or hair that feels dry or thin. Your hair will most likely return to normal after your baby is born.  Your breasts will continue to grow and they will continue to become tender. A yellow fluid (colostrum) may leak from your breasts. This is the first milk you are producing for your baby.  Your belly button may stick out.  You may notice more swelling in your hands,  face, or ankles.  You may have increased tingling or numbness in your hands, arms, and legs. The skin on your belly may also feel numb.  You may feel short of breath because of your expanding uterus.  You may have more problems sleeping. This can be caused by the size of your belly, increased need to urinate, and an increase in your body's metabolism.  You may notice the fetus "dropping," or moving lower in your abdomen (lightening).  You may have increased vaginal discharge.  You may notice your joints feel loose and you may have pain around your pelvic bone. What to expect at prenatal visits You will have prenatal exams every 2 weeks until week 36. Then you will have weekly prenatal exams. During a routine prenatal visit:  You will be weighed to make sure you and the baby are growing normally.  Your blood pressure will be taken.  Your abdomen will be measured to track your baby's growth.  The fetal heartbeat will be listened to.  Any test results from the previous visit will be discussed.  You may have a cervical check near your due date to see if your cervix has softened or thinned (effaced).  You will be tested for Group B streptococcus. This happens between 35 and 37 weeks. Your health care provider may ask you:  What your birth plan is.  How you are feeling.  If you are feeling the baby move.  If you have had any abnormal   symptoms, such as leaking fluid, bleeding, severe headaches, or abdominal cramping.  If you are using any tobacco products, including cigarettes, chewing tobacco, and electronic cigarettes.  If you have any questions. Other tests or screenings that may be performed during your third trimester include:  Blood tests that check for low iron levels (anemia).  Fetal testing to check the health, activity level, and growth of the fetus. Testing is done if you have certain medical conditions or if there are problems during the pregnancy.  Nonstress test  (NST). This test checks the health of your baby to make sure there are no signs of problems, such as the baby not getting enough oxygen. During this test, a belt is placed around your belly. The baby is made to move, and its heart rate is monitored during movement. What is false labor? False labor is a condition in which you feel small, irregular tightenings of the muscles in the womb (contractions) that usually go away with rest, changing position, or drinking water. These are called Braxton Hicks contractions. Contractions may last for hours, days, or even weeks before true labor sets in. If contractions come at regular intervals, become more frequent, increase in intensity, or become painful, you should see your health care provider. What are the signs of labor?  Abdominal cramps.  Regular contractions that start at 10 minutes apart and become stronger and more frequent with time.  Contractions that start on the top of the uterus and spread down to the lower abdomen and back.  Increased pelvic pressure and dull back pain.  A watery or bloody mucus discharge that comes from the vagina.  Leaking of amniotic fluid. This is also known as your "water breaking." It could be a slow trickle or a gush. Let your health care provider know if it has a color or strange odor. If you have any of these signs, call your health care provider right away, even if it is before your due date. Follow these instructions at home: Medicines  Follow your health care provider's instructions regarding medicine use. Specific medicines may be either safe or unsafe to take during pregnancy.  Take a prenatal vitamin that contains at least 600 micrograms (mcg) of folic acid.  If you develop constipation, try taking a stool softener if your health care provider approves. Eating and drinking   Eat a balanced diet that includes fresh fruits and vegetables, whole grains, good sources of protein such as meat, eggs, or tofu,  and low-fat dairy. Your health care provider will help you determine the amount of weight gain that is right for you.  Avoid raw meat and uncooked cheese. These carry germs that can cause birth defects in the baby.  If you have low calcium intake from food, talk to your health care provider about whether you should take a daily calcium supplement.  Eat four or five small meals rather than three large meals a day.  Limit foods that are high in fat and processed sugars, such as fried and sweet foods.  To prevent constipation: ? Drink enough fluid to keep your urine clear or pale yellow. ? Eat foods that are high in fiber, such as fresh fruits and vegetables, whole grains, and beans. Activity  Exercise only as directed by your health care provider. Most women can continue their usual exercise routine during pregnancy. Try to exercise for 30 minutes at least 5 days a week. Stop exercising if you experience uterine contractions.  Avoid heavy lifting.  Do   not exercise in extreme heat or humidity, or at high altitudes.  Wear low-heel, comfortable shoes.  Practice good posture.  You may continue to have sex unless your health care provider tells you otherwise. Relieving pain and discomfort  Take frequent breaks and rest with your legs elevated if you have leg cramps or low back pain.  Take warm sitz baths to soothe any pain or discomfort caused by hemorrhoids. Use hemorrhoid cream if your health care provider approves.  Wear a good support bra to prevent discomfort from breast tenderness.  If you develop varicose veins: ? Wear support pantyhose or compression stockings as told by your healthcare provider. ? Elevate your feet for 15 minutes, 3-4 times a day. Prenatal care  Write down your questions. Take them to your prenatal visits.  Keep all your prenatal visits as told by your health care provider. This is important. Safety  Wear your seat belt at all times when driving.  Make  a list of emergency phone numbers, including numbers for family, friends, the hospital, and police and fire departments. General instructions  Avoid cat litter boxes and soil used by cats. These carry germs that can cause birth defects in the baby. If you have a cat, ask someone to clean the litter box for you.  Do not travel far distances unless it is absolutely necessary and only with the approval of your health care provider.  Do not use hot tubs, steam rooms, or saunas.  Do not drink alcohol.  Do not use any products that contain nicotine or tobacco, such as cigarettes and e-cigarettes. If you need help quitting, ask your health care provider.  Do not use any medicinal herbs or unprescribed drugs. These chemicals affect the formation and growth of the baby.  Do not douche or use tampons or scented sanitary pads.  Do not cross your legs for long periods of time.  To prepare for the arrival of your baby: ? Take prenatal classes to understand, practice, and ask questions about labor and delivery. ? Make a trial run to the hospital. ? Visit the hospital and tour the maternity area. ? Arrange for maternity or paternity leave through employers. ? Arrange for family and friends to take care of pets while you are in the hospital. ? Purchase a rear-facing car seat and make sure you know how to install it in your car. ? Pack your hospital bag. ? Prepare the baby's nursery. Make sure to remove all pillows and stuffed animals from the baby's crib to prevent suffocation.  Visit your dentist if you have not gone during your pregnancy. Use a soft toothbrush to brush your teeth and be gentle when you floss. Contact a health care provider if:  You are unsure if you are in labor or if your water has broken.  You become dizzy.  You have mild pelvic cramps, pelvic pressure, or nagging pain in your abdominal area.  You have lower back pain.  You have persistent nausea, vomiting, or diarrhea.   You have an unusual or bad smelling vaginal discharge.  You have pain when you urinate. Get help right away if:  Your water breaks before 37 weeks.  You have regular contractions less than 5 minutes apart before 37 weeks.  You have a fever.  You are leaking fluid from your vagina.  You have spotting or bleeding from your vagina.  You have severe abdominal pain or cramping.  You have rapid weight loss or weight gain.  You have   shortness of breath with chest pain.  You notice sudden or extreme swelling of your face, hands, ankles, feet, or legs.  Your baby makes fewer than 10 movements in 2 hours.  You have severe headaches that do not go away when you take medicine.  You have vision changes. Summary  The third trimester is from week 28 through week 40, months 7 through 9. The third trimester is a time when the unborn baby (fetus) is growing rapidly.  During the third trimester, your discomfort may increase as you and your baby continue to gain weight. You may have abdominal, leg, and back pain, sleeping problems, and an increased need to urinate.  During the third trimester your breasts will keep growing and they will continue to become tender. A yellow fluid (colostrum) may leak from your breasts. This is the first milk you are producing for your baby.  False labor is a condition in which you feel small, irregular tightenings of the muscles in the womb (contractions) that eventually go away. These are called Braxton Hicks contractions. Contractions may last for hours, days, or even weeks before true labor sets in.  Signs of labor can include: abdominal cramps; regular contractions that start at 10 minutes apart and become stronger and more frequent with time; watery or bloody mucus discharge that comes from the vagina; increased pelvic pressure and dull back pain; and leaking of amniotic fluid. This information is not intended to replace advice given to you by your health  care provider. Make sure you discuss any questions you have with your health care provider. Document Released: 10/27/2001 Document Revised: 02/23/2019 Document Reviewed: 12/08/2016 Elsevier Patient Education  2020 [employer]  

## 2019-06-29 LAB — 28 WEEK RH+PANEL
Basophils Absolute: 0 10*3/uL (ref 0.0–0.2)
Basos: 0 %
EOS (ABSOLUTE): 0.1 10*3/uL (ref 0.0–0.4)
Eos: 1 %
Gestational Diabetes Screen: 173 mg/dL — ABNORMAL HIGH (ref 65–139)
HIV Screen 4th Generation wRfx: NONREACTIVE
Hematocrit: 34 % (ref 34.0–46.6)
Hemoglobin: 11.5 g/dL (ref 11.1–15.9)
Immature Grans (Abs): 0.1 10*3/uL (ref 0.0–0.1)
Immature Granulocytes: 1 %
Lymphocytes Absolute: 1.5 10*3/uL (ref 0.7–3.1)
Lymphs: 19 %
MCH: 30 pg (ref 26.6–33.0)
MCHC: 33.8 g/dL (ref 31.5–35.7)
MCV: 89 fL (ref 79–97)
Monocytes Absolute: 0.3 10*3/uL (ref 0.1–0.9)
Monocytes: 3 %
Neutrophils Absolute: 6 10*3/uL (ref 1.4–7.0)
Neutrophils: 76 %
Platelets: 215 10*3/uL (ref 150–450)
RBC: 3.83 x10E6/uL (ref 3.77–5.28)
RDW: 12.7 % (ref 11.7–15.4)
RPR Ser Ql: NONREACTIVE
WBC: 7.9 10*3/uL (ref 3.4–10.8)

## 2019-06-30 ENCOUNTER — Other Ambulatory Visit: Payer: Self-pay | Admitting: Maternal Newborn

## 2019-06-30 DIAGNOSIS — R7309 Other abnormal glucose: Secondary | ICD-10-CM

## 2019-06-30 NOTE — Progress Notes (Signed)
3 hour GTT ordered, left message for patient to schedule lab visit.

## 2019-07-07 ENCOUNTER — Telehealth: Payer: Self-pay | Admitting: Obstetrics and Gynecology

## 2019-07-07 NOTE — Telephone Encounter (Signed)
Patient is aware of H&P at [facility] on 09/13/19 @ 8:40am w/ Dr. Georgianne Fick, Pre-admit testing to be scheduled, Covid testing on 09/15/19, and OR on 09/19/19.

## 2019-07-07 NOTE — Telephone Encounter (Signed)
-----   Message from Rexene Agent, CNM sent at 06/29/2019  2:10 PM EDT ----- Regarding: Schedule Cesarean delivery Surgery Booking Request Patient Full Name:   MRN: [id number]  DOB: March 14, 1986  Surgeon: Malachy Mood, MD Requested Surgery Date and Time: 09/19/2019, first available time Primary Diagnosis AND Code: Cesarean section Secondary Diagnosis and Code:  Surgical Procedure: Cesarean Section L&D Notification: Yes Admission Status: surgery admit Length of Surgery: 1 hour Special Case Needs: On-Q pump H&P: Needs Phone Interview???:  Interpreter: Not needed Language:  Medical Clearance:  Special Scheduling Instructions:

## 2019-07-10 ENCOUNTER — Other Ambulatory Visit: Payer: Self-pay

## 2019-07-10 ENCOUNTER — Other Ambulatory Visit: Payer: 59

## 2019-07-10 DIAGNOSIS — R7309 Other abnormal glucose: Secondary | ICD-10-CM

## 2019-07-11 LAB — GESTATIONAL GLUCOSE TOLERANCE
Glucose, Fasting: 84 mg/dL (ref 65–94)
Glucose, GTT - 1 Hour: 179 mg/dL (ref 65–179)
Glucose, GTT - 2 Hour: 161 mg/dL — ABNORMAL HIGH (ref 65–154)
Glucose, GTT - 3 Hour: 134 mg/dL (ref 65–139)

## 2019-07-12 ENCOUNTER — Encounter: Payer: Self-pay | Admitting: Advanced Practice Midwife

## 2019-07-12 ENCOUNTER — Ambulatory Visit (INDEPENDENT_AMBULATORY_CARE_PROVIDER_SITE_OTHER): Payer: 59 | Admitting: Advanced Practice Midwife

## 2019-07-12 ENCOUNTER — Other Ambulatory Visit: Payer: Self-pay

## 2019-07-12 VITALS — BP 110/60 | Wt 169.2 lb

## 2019-07-12 DIAGNOSIS — Z3A29 29 weeks gestation of pregnancy: Secondary | ICD-10-CM

## 2019-07-12 DIAGNOSIS — O34219 Maternal care for unspecified type scar from previous cesarean delivery: Secondary | ICD-10-CM

## 2019-07-12 LAB — POCT URINALYSIS DIPSTICK OB

## 2019-07-12 NOTE — Patient Instructions (Signed)
Third Trimester of Pregnancy The third trimester is from week 28 through week 40 (months 7 through 9). The third trimester is a time when the unborn baby (fetus) is growing rapidly. At the end of the ninth month, the fetus is about 20 inches in length and weighs 6-10 pounds. Body changes during your third trimester Your body will continue to go through many changes during pregnancy. The changes vary from woman to woman. During the third trimester:  Your weight will continue to increase. You can expect to gain 25-35 pounds (11-16 kg) by the end of the pregnancy.  You may begin to get stretch marks on your hips, abdomen, and breasts.  You may urinate more often because the fetus is moving lower into your pelvis and pressing on your bladder.  You may develop or continue to have heartburn. This is caused by increased hormones that slow down muscles in the digestive tract.  You may develop or continue to have constipation because increased hormones slow digestion and cause the muscles that push waste through your intestines to relax.  You may develop hemorrhoids. These are swollen veins (varicose veins) in the rectum that can itch or be painful.  You may develop swollen, bulging veins (varicose veins) in your legs.  You may have increased body aches in the pelvis, back, or thighs. This is due to weight gain and increased hormones that are relaxing your joints.  You may have changes in your hair. These can include thickening of your hair, rapid growth, and changes in texture. Some women also have hair loss during or after pregnancy, or hair that feels dry or thin. Your hair will most likely return to normal after your baby is born.  Your breasts will continue to grow and they will continue to become tender. A yellow fluid (colostrum) may leak from your breasts. This is the first milk you are producing for your baby.  Your belly button may stick out.  You may notice more swelling in your hands,  face, or ankles.  You may have increased tingling or numbness in your hands, arms, and legs. The skin on your belly may also feel numb.  You may feel short of breath because of your expanding uterus.  You may have more problems sleeping. This can be caused by the size of your belly, increased need to urinate, and an increase in your body's metabolism.  You may notice the fetus "dropping," or moving lower in your abdomen (lightening).  You may have increased vaginal discharge.  You may notice your joints feel loose and you may have pain around your pelvic bone. What to expect at prenatal visits You will have prenatal exams every 2 weeks until week 36. Then you will have weekly prenatal exams. During a routine prenatal visit:  You will be weighed to make sure you and the baby are growing normally.  Your blood pressure will be taken.  Your abdomen will be measured to track your baby's growth.  The fetal heartbeat will be listened to.  Any test results from the previous visit will be discussed.  You may have a cervical check near your due date to see if your cervix has softened or thinned (effaced).  You will be tested for Group B streptococcus. This happens between 35 and 37 weeks. Your health care provider may ask you:  What your birth plan is.  How you are feeling.  If you are feeling the baby move.  If you have had any abnormal   symptoms, such as leaking fluid, bleeding, severe headaches, or abdominal cramping.  If you are using any tobacco products, including cigarettes, chewing tobacco, and electronic cigarettes.  If you have any questions. Other tests or screenings that may be performed during your third trimester include:  Blood tests that check for low iron levels (anemia).  Fetal testing to check the health, activity level, and growth of the fetus. Testing is done if you have certain medical conditions or if there are problems during the pregnancy.  Nonstress test  (NST). This test checks the health of your baby to make sure there are no signs of problems, such as the baby not getting enough oxygen. During this test, a belt is placed around your belly. The baby is made to move, and its heart rate is monitored during movement. What is false labor? False labor is a condition in which you feel small, irregular tightenings of the muscles in the womb (contractions) that usually go away with rest, changing position, or drinking water. These are called Braxton Hicks contractions. Contractions may last for hours, days, or even weeks before true labor sets in. If contractions come at regular intervals, become more frequent, increase in intensity, or become painful, you should see your health care provider. What are the signs of labor?  Abdominal cramps.  Regular contractions that start at 10 minutes apart and become stronger and more frequent with time.  Contractions that start on the top of the uterus and spread down to the lower abdomen and back.  Increased pelvic pressure and dull back pain.  A watery or bloody mucus discharge that comes from the vagina.  Leaking of amniotic fluid. This is also known as your "water breaking." It could be a slow trickle or a gush. Let your health care provider know if it has a color or strange odor. If you have any of these signs, call your health care provider right away, even if it is before your due date. Follow these instructions at home: Medicines  Follow your health care provider's instructions regarding medicine use. Specific medicines may be either safe or unsafe to take during pregnancy.  Take a prenatal vitamin that contains at least 600 micrograms (mcg) of folic acid.  If you develop constipation, try taking a stool softener if your health care provider approves. Eating and drinking   Eat a balanced diet that includes fresh fruits and vegetables, whole grains, good sources of protein such as meat, eggs, or tofu,  and low-fat dairy. Your health care provider will help you determine the amount of weight gain that is right for you.  Avoid raw meat and uncooked cheese. These carry germs that can cause birth defects in the baby.  If you have low calcium intake from food, talk to your health care provider about whether you should take a daily calcium supplement.  Eat four or five small meals rather than three large meals a day.  Limit foods that are high in fat and processed sugars, such as fried and sweet foods.  To prevent constipation: ? Drink enough fluid to keep your urine clear or pale yellow. ? Eat foods that are high in fiber, such as fresh fruits and vegetables, whole grains, and beans. Activity  Exercise only as directed by your health care provider. Most women can continue their usual exercise routine during pregnancy. Try to exercise for 30 minutes at least 5 days a week. Stop exercising if you experience uterine contractions.  Avoid heavy lifting.  Do   not exercise in extreme heat or humidity, or at high altitudes.  Wear low-heel, comfortable shoes.  Practice good posture.  You may continue to have sex unless your health care provider tells you otherwise. Relieving pain and discomfort  Take frequent breaks and rest with your legs elevated if you have leg cramps or low back pain.  Take warm sitz baths to soothe any pain or discomfort caused by hemorrhoids. Use hemorrhoid cream if your health care provider approves.  Wear a good support bra to prevent discomfort from breast tenderness.  If you develop varicose veins: ? Wear support pantyhose or compression stockings as told by your healthcare provider. ? Elevate your feet for 15 minutes, 3-4 times a day. Prenatal care  Write down your questions. Take them to your prenatal visits.  Keep all your prenatal visits as told by your health care provider. This is important. Safety  Wear your seat belt at all times when driving.  Make  a list of emergency phone numbers, including numbers for family, friends, the hospital, and police and fire departments. General instructions  Avoid cat litter boxes and soil used by cats. These carry germs that can cause birth defects in the baby. If you have a cat, ask someone to clean the litter box for you.  Do not travel far distances unless it is absolutely necessary and only with the approval of your health care provider.  Do not use hot tubs, steam rooms, or saunas.  Do not drink alcohol.  Do not use any products that contain nicotine or tobacco, such as cigarettes and e-cigarettes. If you need help quitting, ask your health care provider.  Do not use any medicinal herbs or unprescribed drugs. These chemicals affect the formation and growth of the baby.  Do not douche or use tampons or scented sanitary pads.  Do not cross your legs for long periods of time.  To prepare for the arrival of your baby: ? Take prenatal classes to understand, practice, and ask questions about labor and delivery. ? Make a trial run to the hospital. ? Visit the hospital and tour the maternity area. ? Arrange for maternity or paternity leave through employers. ? Arrange for family and friends to take care of pets while you are in the hospital. ? Purchase a rear-facing car seat and make sure you know how to install it in your car. ? Pack your hospital bag. ? Prepare the baby's nursery. Make sure to remove all pillows and stuffed animals from the baby's crib to prevent suffocation.  Visit your dentist if you have not gone during your pregnancy. Use a soft toothbrush to brush your teeth and be gentle when you floss. Contact a health care provider if:  You are unsure if you are in labor or if your water has broken.  You become dizzy.  You have mild pelvic cramps, pelvic pressure, or nagging pain in your abdominal area.  You have lower back pain.  You have persistent nausea, vomiting, or diarrhea.   You have an unusual or bad smelling vaginal discharge.  You have pain when you urinate. Get help right away if:  Your water breaks before 37 weeks.  You have regular contractions less than 5 minutes apart before 37 weeks.  You have a fever.  You are leaking fluid from your vagina.  You have spotting or bleeding from your vagina.  You have severe abdominal pain or cramping.  You have rapid weight loss or weight gain.  You have   shortness of breath with chest pain.  You notice sudden or extreme swelling of your face, hands, ankles, feet, or legs.  Your baby makes fewer than 10 movements in 2 hours.  You have severe headaches that do not go away when you take medicine.  You have vision changes. Summary  The third trimester is from week 28 through week 40, months 7 through 9. The third trimester is a time when the unborn baby (fetus) is growing rapidly.  During the third trimester, your discomfort may increase as you and your baby continue to gain weight. You may have abdominal, leg, and back pain, sleeping problems, and an increased need to urinate.  During the third trimester your breasts will keep growing and they will continue to become tender. A yellow fluid (colostrum) may leak from your breasts. This is the first milk you are producing for your baby.  False labor is a condition in which you feel small, irregular tightenings of the muscles in the womb (contractions) that eventually go away. These are called Braxton Hicks contractions. Contractions may last for hours, days, or even weeks before true labor sets in.  Signs of labor can include: abdominal cramps; regular contractions that start at 10 minutes apart and become stronger and more frequent with time; watery or bloody mucus discharge that comes from the vagina; increased pelvic pressure and dull back pain; and leaking of amniotic fluid. This information is not intended to replace advice given to you by your health  care provider. Make sure you discuss any questions you have with your health care provider. Document Released: 10/27/2001 Document Revised: 02/23/2019 Document Reviewed: 12/08/2016 Elsevier Patient Education  2020 [employer]  

## 2019-07-12 NOTE — Progress Notes (Signed)
Routine Prenatal Care Visit  Subjective  Destiny Allen is a 33 y.o. [phone] at [redacted]w[redacted]d being seen today for ongoing prenatal care.  She is currently monitored for the following issues for this high-risk pregnancy and has History of ectopic pregnancy; Partial molar pregnancy; Iron deficiency anemia; Mixed hyperlipidemia; Vitamin D deficiency; Impaired fasting glucose; Supervision of high risk pregnancy, antepartum; History of cesarean delivery affecting pregnancy; and Antepartum multigravida of advanced maternal age on their problem list.  ----------------------------------------------------------------------------------- Patient reports mild discomforts of 3rd trimester.  We reviewed her 3 hour gtt results. Encouraged healthy diet- less sugar. She admits having sweet tea with lunch today.  Contractions: Not present. Vag. Bleeding: None.  Movement: Present. Leaking Fluid denies.  ----------------------------------------------------------------------------------- The following portions of the patient's history were reviewed and updated as appropriate: allergies, current medications, past family history, past medical history, past social history, past surgical history and problem list. Problem list updated.  Objective  Blood pressure 110/60, weight 169 lb 3.2 oz (76.7 kg), last menstrual period 12/05/2018 Pregravid weight 136 lb (61.7 kg) Total Weight Gain 33 lb 3.2 oz (15.1 kg) Urinalysis: Urine Protein Trace  Urine Glucose (!) Moderate (2+)  Fetal Status: Fetal Heart Rate (bpm): 136 Fundal Height: 31 cm Movement: Present     General:  Alert, oriented and cooperative. Patient is in no acute distress.  Skin: Skin is warm and dry. No rash noted.   Cardiovascular: Normal heart rate noted  Respiratory: Normal respiratory effort, no problems with respiration noted  Abdomen: Soft, gravid, appropriate for gestational age. Pain/Pressure: Absent     Pelvic:  Cervical exam deferred        Extremities:  Normal range of motion.  Edema: None  Mental Status: Normal mood and affect. Normal behavior. Normal judgment and thought content.   Assessment   33 y.o. [id number] at [redacted]w[redacted]d by  09/22/2019, by Ultrasound presenting for routine prenatal visit  Plan   Pregnancy #4 Problems (from 12/05/18 to present)    Problem Noted Resolved   Supervision of high risk pregnancy, antepartum 02/21/2019 by Homero Fellers, MD No   Overview Addendum 04/05/2019 11:50 AM by Homero Fellers, MD      Clinic [facility] Prenatal Labs  Dating  5 wk [place] Blood type: A/Positive/-- (04/07 1636)   Genetic Screen Declined    Antibody:Negative (04/07 1636)  Anatomic [place]  Rubella: 24.50 (04/07 1636)  Varicella: Immune  GTT Early: 61  28 wk:      RPR: Non Reactive (04/07 1636)   Rhogam  not needed HBsAg: Negative (04/07 1636)   TDaP vaccine                       HIV: Non Reactive (04/07 1636)   Flu Shot                                GBS:   Contraception  Pap: NIL 2018  CBB     CS/VBAC  Hx of cesarean section   Baby Food    Support Person             History of cesarean delivery affecting pregnancy 02/21/2019 by Homero Fellers, MD No   Antepartum multigravida of advanced maternal age 68/05/2019 by Homero Fellers, MD No       Preterm labor symptoms and general obstetric precautions including but not limited to vaginal bleeding, contractions, leaking of fluid and  fetal movement were reviewed in detail with the patient. Please refer to After Visit Summary for other counseling recommendations.   Return in about 2 weeks (around 07/26/2019) for rob.  Tresea MallJane Marquia Costello, CNM 07/12/2019 4:35 PM

## 2019-07-12 NOTE — Progress Notes (Signed)
No problems.rj 

## 2019-07-28 ENCOUNTER — Ambulatory Visit: Payer: 59 | Admitting: Certified Nurse Midwife

## 2019-07-28 ENCOUNTER — Other Ambulatory Visit: Payer: Self-pay

## 2019-07-28 ENCOUNTER — Encounter: Payer: 59 | Admitting: Obstetrics and Gynecology

## 2019-07-28 VITALS — BP 116/78 | Wt 168.0 lb

## 2019-07-28 DIAGNOSIS — O0990 Supervision of high risk pregnancy, unspecified, unspecified trimester: Secondary | ICD-10-CM

## 2019-07-28 DIAGNOSIS — Z3A32 32 weeks gestation of pregnancy: Secondary | ICD-10-CM

## 2019-07-28 DIAGNOSIS — O34219 Maternal care for unspecified type scar from previous cesarean delivery: Secondary | ICD-10-CM

## 2019-07-28 DIAGNOSIS — Z23 Encounter for immunization: Secondary | ICD-10-CM | POA: Diagnosis not present

## 2019-07-28 DIAGNOSIS — O26843 Uterine size-date discrepancy, third trimester: Secondary | ICD-10-CM

## 2019-07-28 DIAGNOSIS — O0993 Supervision of high risk pregnancy, unspecified, third trimester: Secondary | ICD-10-CM

## 2019-07-28 LAB — POCT URINALYSIS DIPSTICK OB
Glucose, UA: NEGATIVE
POC,PROTEIN,UA: NEGATIVE

## 2019-07-28 NOTE — Progress Notes (Signed)
ROB

## 2019-07-30 ENCOUNTER — Encounter: Payer: Self-pay | Admitting: Certified Nurse Midwife

## 2019-07-30 NOTE — Progress Notes (Signed)
HROB at [redacted] weeks gestation: Baby active. No regular contractions, vaginal bleeding or LOF. Has repeat Cesarean section tentatively scheduled for 11/3 with Dr Georgianne Fick [place] today 38cm/ FHT 127 TDAP today. Signed blood transfusion consent Breast/ bottle ROB/ growth scan and AFI in 1 week.  Dalia Heading, CNM

## 2019-07-31 ENCOUNTER — Telehealth: Payer: Self-pay

## 2019-07-31 ENCOUNTER — Other Ambulatory Visit: Payer: Self-pay

## 2019-07-31 ENCOUNTER — Ambulatory Visit: Payer: 59

## 2019-07-31 NOTE — Telephone Encounter (Addendum)
Pt calling c/o being more tired than normal; started this weekend; has pressure in lower abd.  [phone]  Pt states lower abd is hard top is soft.  Also states lower back is achy.  Tx'd to [facility] for appt.

## 2019-08-07 ENCOUNTER — Ambulatory Visit (INDEPENDENT_AMBULATORY_CARE_PROVIDER_SITE_OTHER): Payer: 59 | Admitting: Obstetrics and Gynecology

## 2019-08-07 ENCOUNTER — Ambulatory Visit (INDEPENDENT_AMBULATORY_CARE_PROVIDER_SITE_OTHER): Payer: 59

## 2019-08-07 ENCOUNTER — Other Ambulatory Visit: Payer: Self-pay

## 2019-08-07 VITALS — BP 116/74 | Wt 170.0 lb

## 2019-08-07 DIAGNOSIS — O26843 Uterine size-date discrepancy, third trimester: Secondary | ICD-10-CM

## 2019-08-07 DIAGNOSIS — Z3A33 33 weeks gestation of pregnancy: Secondary | ICD-10-CM | POA: Diagnosis not present

## 2019-08-07 DIAGNOSIS — O09523 Supervision of elderly multigravida, third trimester: Secondary | ICD-10-CM

## 2019-08-07 DIAGNOSIS — O09529 Supervision of elderly multigravida, unspecified trimester: Secondary | ICD-10-CM

## 2019-08-07 DIAGNOSIS — O0990 Supervision of high risk pregnancy, unspecified, unspecified trimester: Secondary | ICD-10-CM

## 2019-08-07 DIAGNOSIS — O34219 Maternal care for unspecified type scar from previous cesarean delivery: Secondary | ICD-10-CM

## 2019-08-07 DIAGNOSIS — O0993 Supervision of high risk pregnancy, unspecified, third trimester: Secondary | ICD-10-CM

## 2019-08-07 LAB — POCT URINALYSIS DIPSTICK OB
Glucose, UA: NEGATIVE
POC,PROTEIN,UA: NEGATIVE

## 2019-08-07 NOTE — Progress Notes (Signed)
Routine Prenatal Care Visit  Subjective  Destiny Allen is a 33 y.o. [phone] [id number] at 3373w3d being seen today for ongoing prenatal care.  She is currently monitored for the following issues for this high-risk pregnancy and has History of ectopic pregnancy; Partial molar pregnancy; Iron deficiency anemia; Mixed hyperlipidemia; Vitamin D deficiency; Impaired fasting glucose; Supervision of high risk pregnancy, antepartum; History of cesarean delivery affecting pregnancy; and Antepartum multigravida of advanced maternal age on their problem list.  ----------------------------------------------------------------------------------- Patient reports no complaints.   Contractions: Not present. Vag. Bleeding: None.  Movement: Present. Denies leaking of fluid.  ----------------------------------------------------------------------------------- The following portions of the patient's history were reviewed and updated as appropriate: allergies, current medications, past family history, past medical history, past social history, past surgical history and problem list. Problem list updated.   Objective  Blood pressure 116/74, weight 170 lb (77.1 kg), last menstrual period 12/05/2018, unknown if currently breastfeeding. Pregravid weight 136 lb (61.7 kg) Total Weight Gain 34 lb (15.4 kg) Urinalysis:      Fetal Status: Fetal Heart Rate (bpm): 145 Fundal Height: 36 cm Movement: Present  Presentation: Vertex  General:  Alert, oriented and cooperative. Patient is in no acute distress.  Skin: Skin is warm and dry. No rash noted.   Cardiovascular: Normal heart rate noted  Respiratory: Normal respiratory effort, no problems with respiration noted  Abdomen: Soft, gravid, appropriate for gestational age. Pain/Pressure: Present     Pelvic:  Cervical exam deferred        Extremities: Normal range of motion.     ental Status: Normal mood and affect. Normal behavior. Normal judgment and thought content.   [place] Ob Follow  Up  Result Date: 08/07/2019 Patient Name: Destiny Allen DOB: [birth year] MRN: [id number] ULTRASOUND REPORT ULTRASOUND REPORT Location: [facility] Date of Service: 08/07/2019 Indications:growth/afi Findings: Mason JimSingleton intrauterine pregnancy is visualized with FHR at 142 BPM. Biometrics give an (U/S) Gestational age of 5054w2d and an (U/S) EDD of 09/16/2019; this correlates with the clinically established Estimated Date of Delivery: 09/22/19. Fetal presentation is Cephalic. Placenta: posterior. Grade: 1 AFI: 14.2 cm Growth percentile is 78.8%. EFW: 2725 g (6 lb 0 oz ) Impression: 1. 6673w3d Viable Singleton Intrauterine pregnancy previously established criteria. 2. Growth is 78.8 %ile.  AFI is 14.2 cm. Recommendations: 1.Clinical correlation with the patient's History and Physical Exam. Deanna ArtisElyse S Fairbanks, RT There is a singleton gestation with normal amniotic fluid volume. The fetal biometry correlates with established dating.  Limited fetal anatomy was performed.The visualized fetal anatomical survey appears within normal limits within the resolution of ultrasound as described above.  It must be noted that a normal ultrasound is unable to rule out fetal aneuploidy.  Vena AustriaAndreas Blimy Napoleon, MD, Evern CoreFACOG [facility], [facility] 08/07/2019, 4:32 PM     Assessment   33 y.o. [id number] at 353w3d by  09/22/2019, by Ultrasound presenting for routine prenatal visit  Plan   Pregnancy #4 Problems (from 12/05/18 to present)    Problem Noted Resolved   Supervision of high risk pregnancy, antepartum 02/21/2019 by Natale MilchSchuman, Christanna R, MD No   Overview Addendum 07/30/2019  8:49 PM by Farrel ConnersGutierrez, Colleen, CNM      Clinic [facility] Prenatal Labs  Dating  5 wk US Blood type: A/Positive/-- (04/07 1636)   Genetic Screen Declined    Antibody:Negative (04/07 1636)  Anatomic US  Rubella: 24.50 (04/07 1636)  Varicella: Immune  GTT Early: 71  28 wk:   173; 3 hour GTT: 87, 179, 161,  134   RPR: Non Reactive (04/07 1636)    Rhogam  not needed HBsAg: Negative (04/07 1636)   TDaP vaccine        07/28/19               HIV: Non Reactive (04/07 1636)   Flu Shot                                GBS:   Contraception  Pap: NIL 2018  CBB     CS/VBAC  Hx of cesarean section   Baby Food Breast and bottle   Support Person             History of cesarean delivery affecting pregnancy 02/21/2019 by Homero Fellers, MD No   Antepartum multigravida of advanced maternal age 21/05/2019 by Homero Fellers, MD No       Gestational age appropriate obstetric precautions including but not limited to vaginal bleeding, contractions, leaking of fluid and fetal movement were reviewed in detail with the patient.    Return in about 2 weeks (around 08/21/2019) for ROB.  Malachy Mood, MD, [facility], [facility] 08/07/2019, 4:50 PM

## 2019-08-07 NOTE — Progress Notes (Signed)
ROB AFI/growth scan

## 2019-08-18 ENCOUNTER — Telehealth: Payer: Self-pay

## 2019-08-18 ENCOUNTER — Other Ambulatory Visit: Payer: Self-pay

## 2019-08-18 DIAGNOSIS — Z20822 Contact with and (suspected) exposure to covid-19: Secondary | ICD-10-CM

## 2019-08-18 DIAGNOSIS — Z20828 Contact with and (suspected) exposure to other viral communicable diseases: Secondary | ICD-10-CM | POA: Diagnosis not present

## 2019-08-18 NOTE — Telephone Encounter (Signed)
Pt calling c/o congestion; work wanted her to call us about covid and testing.  [phone]  Number for [facility] given.  Adv to let them know she is preg and what her work told her.  Adv to treat sxs so she can take plain sudafed, e.s. tylenol, push fluids.  Pt states she feels fine otherwise.

## 2019-08-19 LAB — NOVEL CORONAVIRUS, NAA: SARS-CoV-2, NAA: NOT DETECTED

## 2019-08-21 ENCOUNTER — Encounter: Payer: 59 | Admitting: Obstetrics and Gynecology

## 2019-08-22 ENCOUNTER — Telehealth: Payer: Self-pay | Admitting: General Practice

## 2019-08-22 NOTE — Telephone Encounter (Signed)
Negative COVID results given. Patient results "NOT Detected." Caller expressed understanding. ° °

## 2019-08-25 ENCOUNTER — Ambulatory Visit (INDEPENDENT_AMBULATORY_CARE_PROVIDER_SITE_OTHER): Payer: 59 | Admitting: Obstetrics and Gynecology

## 2019-08-25 ENCOUNTER — Other Ambulatory Visit: Payer: Self-pay

## 2019-08-25 VITALS — BP 112/66 | Wt 169.0 lb

## 2019-08-25 DIAGNOSIS — O09529 Supervision of elderly multigravida, unspecified trimester: Secondary | ICD-10-CM

## 2019-08-25 DIAGNOSIS — O0990 Supervision of high risk pregnancy, unspecified, unspecified trimester: Secondary | ICD-10-CM | POA: Diagnosis not present

## 2019-08-25 DIAGNOSIS — Z3685 Encounter for antenatal screening for Streptococcus B: Secondary | ICD-10-CM

## 2019-08-25 DIAGNOSIS — O34219 Maternal care for unspecified type scar from previous cesarean delivery: Secondary | ICD-10-CM | POA: Diagnosis not present

## 2019-08-25 DIAGNOSIS — O0993 Supervision of high risk pregnancy, unspecified, third trimester: Secondary | ICD-10-CM

## 2019-08-25 DIAGNOSIS — O09523 Supervision of elderly multigravida, third trimester: Secondary | ICD-10-CM

## 2019-08-25 DIAGNOSIS — Z3A36 36 weeks gestation of pregnancy: Secondary | ICD-10-CM

## 2019-08-25 LAB — POCT URINALYSIS DIPSTICK OB
Glucose, UA: NEGATIVE
POC,PROTEIN,UA: NEGATIVE

## 2019-08-25 NOTE — Progress Notes (Signed)
ROB GBS 

## 2019-08-25 NOTE — Telephone Encounter (Signed)
Per pt's. Request, faxed COVID test results to [employer]; attn. Caren Griffins @ [phone].

## 2019-08-25 NOTE — Telephone Encounter (Signed)
Patient requesting fax be sent to her employer with results.    [employer]  AttnCaren Griffins  Fax# [phone]

## 2019-08-25 NOTE — Progress Notes (Signed)
Routine Prenatal Care Visit  Subjective  Destiny Allen is a 33 y.o. [phone] at [redacted]w[redacted]d being seen today for ongoing prenatal care.  She is currently monitored for the following issues for this high-risk pregnancy and has History of ectopic pregnancy; Partial molar pregnancy; Iron deficiency anemia; Mixed hyperlipidemia; Vitamin D deficiency; Impaired fasting glucose; Supervision of high risk pregnancy, antepartum; History of cesarean delivery affecting pregnancy; and Antepartum multigravida of advanced maternal age on their problem list.  ----------------------------------------------------------------------------------- Patient reports no complaints.   Contractions: Not present. Vag. Bleeding: None.  Movement: Present. Denies leaking of fluid. Does report an episode at night where she wasn't sure if she had inadvertent bladder emptying or if her water broke, no leaking since ----------------------------------------------------------------------------------- The following portions of the patient's history were reviewed and updated as appropriate: allergies, current medications, past family history, past medical history, past social history, past surgical history and problem list. Problem list updated.   Objective  Blood pressure 112/66, weight 169 lb (76.7 kg), last menstrual period 12/05/2018, unknown if currently breastfeeding. Pregravid weight 136 lb (61.7 kg) Total Weight Gain 33 lb (15 kg)  Body mass index is 34.13 kg/m.  Urinalysis:      Fetal Status: Fetal Heart Rate (bpm): 140 Fundal Height: 36 cm Movement: Present  Presentation: Vertex  General:  Alert, oriented and cooperative. Patient is in no acute distress.  Skin: Skin is warm and dry. No rash noted.   Cardiovascular: Normal heart rate noted  Respiratory: Normal respiratory effort, no problems with respiration noted  Abdomen: Soft, gravid, appropriate for gestational age. Pain/Pressure: Present     Pelvic:  Cervical exam  performed        Extremities: Normal range of motion.     ental Status: Normal mood and affect. Normal behavior. Normal judgment and thought content.   Immunization History  Administered Date(s) Administered  . Tdap 07/28/2019   Negative ferning, negative nitrazine  Assessment   33 y.o. [id number] at [redacted]w[redacted]d by  09/22/2019, by Ultrasound presenting for routine prenatal visit  Plan   Pregnancy #4 Problems (from 12/05/18 to present)    Problem Noted Resolved   Supervision of high risk pregnancy, antepartum 02/21/2019 by Homero Fellers, MD No   Overview Addendum 07/30/2019  8:49 PM by Dalia Heading, [place] Prenatal Labs  Dating  5 wk [place] Blood type: A/Positive/-- (04/07 1636)   Genetic Screen Declined    Antibody:Negative (04/07 1636)  Anatomic [place]  Rubella: 24.50 (04/07 1636)  Varicella: Immune  GTT Early: 71  28 wk:   173; 3 hour GTT: 87, 179, 161, 134   RPR: Non Reactive (04/07 1636)   Rhogam  not needed HBsAg: Negative (04/07 1636)   TDaP vaccine  07/28/19               HIV: Non Reactive (04/07 1636)   Flu Shot                                GBS:   Contraception  Pap: NIL 2018  CBB     CS/VBAC  Hx of cesarean section repeat 11/3   Baby Food Breast and bottle   Support Person             History of cesarean delivery affecting pregnancy 02/21/2019 by Homero Fellers, MD No   Antepartum multigravida of advanced maternal age 75/05/2019 by Gilman Schmidt,  Jaquelyn Bitter, MD No       Gestational age appropriate obstetric precautions including but not limited to vaginal bleeding, contractions, leaking of fluid and fetal movement were reviewed in detail with the patient.    GBS today  No follow-ups on file.  Vena Austria, MD, [facility], [facility] 08/25/2019, 9:18 AM

## 2019-08-27 ENCOUNTER — Other Ambulatory Visit: Payer: Self-pay

## 2019-08-27 ENCOUNTER — Inpatient Hospital Stay
Admission: EM | Admit: 2019-08-27 | Discharge: 2019-08-30 | DRG: 788 | Disposition: A | Discharge status: Home / Self Care | Payer: 59 | Attending: Obstetrics & Gynecology | Admitting: Obstetrics & Gynecology

## 2019-08-27 ENCOUNTER — Encounter: Payer: Self-pay | Admitting: *Deleted

## 2019-08-27 DIAGNOSIS — O9089 Other complications of the puerperium, not elsewhere classified: Secondary | ICD-10-CM | POA: Diagnosis not present

## 2019-08-27 DIAGNOSIS — O34219 Maternal care for unspecified type scar from previous cesarean delivery: Secondary | ICD-10-CM | POA: Diagnosis not present

## 2019-08-27 DIAGNOSIS — R1084 Generalized abdominal pain: Secondary | ICD-10-CM | POA: Diagnosis not present

## 2019-08-27 DIAGNOSIS — D649 Anemia, unspecified: Secondary | ICD-10-CM | POA: Diagnosis not present

## 2019-08-27 DIAGNOSIS — R55 Syncope and collapse: Secondary | ICD-10-CM | POA: Diagnosis not present

## 2019-08-27 DIAGNOSIS — R1030 Lower abdominal pain, unspecified: Secondary | ICD-10-CM | POA: Diagnosis present

## 2019-08-27 DIAGNOSIS — Z98891 History of uterine scar from previous surgery: Secondary | ICD-10-CM

## 2019-08-27 DIAGNOSIS — O09529 Supervision of elderly multigravida, unspecified trimester: Secondary | ICD-10-CM

## 2019-08-27 DIAGNOSIS — Z3A36 36 weeks gestation of pregnancy: Secondary | ICD-10-CM

## 2019-08-27 DIAGNOSIS — G8918 Other acute postprocedural pain: Secondary | ICD-10-CM | POA: Diagnosis not present

## 2019-08-27 DIAGNOSIS — Z20828 Contact with and (suspected) exposure to other viral communicable diseases: Secondary | ICD-10-CM | POA: Diagnosis present

## 2019-08-27 DIAGNOSIS — R918 Other nonspecific abnormal finding of lung field: Secondary | ICD-10-CM | POA: Diagnosis not present

## 2019-08-27 DIAGNOSIS — R0682 Tachypnea, not elsewhere classified: Secondary | ICD-10-CM

## 2019-08-27 DIAGNOSIS — Z3490 Encounter for supervision of normal pregnancy, unspecified, unspecified trimester: Secondary | ICD-10-CM

## 2019-08-27 DIAGNOSIS — O26899 Other specified pregnancy related conditions, unspecified trimester: Secondary | ICD-10-CM | POA: Diagnosis present

## 2019-08-27 DIAGNOSIS — O0990 Supervision of high risk pregnancy, unspecified, unspecified trimester: Secondary | ICD-10-CM

## 2019-08-27 LAB — STREP GP B NAA: Strep Gp B NAA: POSITIVE — AB

## 2019-08-27 LAB — URINALYSIS, COMPLETE (UACMP) WITH MICROSCOPIC
Bacteria, UA: NONE SEEN
Bilirubin Urine: NEGATIVE
Glucose, UA: NEGATIVE mg/dL
Hgb urine dipstick: NEGATIVE
Ketones, ur: NEGATIVE mg/dL
Leukocytes,Ua: NEGATIVE
Nitrite: NEGATIVE
Protein, ur: NEGATIVE mg/dL
Specific Gravity, Urine: 1.011 (ref 1.005–1.030)
pH: 7 (ref 5.0–8.0)

## 2019-08-27 LAB — CBC WITH DIFFERENTIAL/PLATELET
Abs Immature Granulocytes: 0.06 10*3/uL (ref 0.00–0.07)
Basophils Absolute: 0 10*3/uL (ref 0.0–0.1)
Basophils Relative: 0 %
Eosinophils Absolute: 0.1 10*3/uL (ref 0.0–0.5)
Eosinophils Relative: 1 %
HCT: 34.1 % — ABNORMAL LOW (ref 36.0–46.0)
Hemoglobin: 11.1 g/dL — ABNORMAL LOW (ref 12.0–15.0)
Immature Granulocytes: 1 %
Lymphocytes Relative: 24 %
Lymphs Abs: 2 10*3/uL (ref 0.7–4.0)
MCH: 28.8 pg (ref 26.0–34.0)
MCHC: 32.6 g/dL (ref 30.0–36.0)
MCV: 88.3 fL (ref 80.0–100.0)
Monocytes Absolute: 0.6 10*3/uL (ref 0.1–1.0)
Monocytes Relative: 7 %
Neutro Abs: 5.7 10*3/uL (ref 1.7–7.7)
Neutrophils Relative %: 67 %
Platelets: 225 10*3/uL (ref 150–400)
RBC: 3.86 MIL/uL — ABNORMAL LOW (ref 3.87–5.11)
RDW: 13.9 % (ref 11.5–15.5)
WBC: 8.4 10*3/uL (ref 4.0–10.5)
nRBC: 0 % (ref 0.0–0.2)

## 2019-08-27 LAB — SAMPLE TO BLOOD BANK

## 2019-08-27 MED ORDER — TERBUTALINE SULFATE 1 MG/ML IJ SOLN
0.2500 mg | Freq: Once | INTRAMUSCULAR | Status: AC
  Administered 2019-08-27: 0.25 mg via SUBCUTANEOUS
  Filled 2019-08-27: qty 1

## 2019-08-27 MED ORDER — LACTATED RINGERS IV BOLUS
500.0000 mL | Freq: Once | INTRAVENOUS | Status: AC
  Administered 2019-08-27: 18:00:00 500 mL via INTRAVENOUS

## 2019-08-27 MED ORDER — SOD CITRATE-CITRIC ACID 500-334 MG/5ML PO SOLN
ORAL | Status: AC
  Filled 2019-08-27: qty 30

## 2019-08-27 MED ORDER — MORPHINE SULFATE (PF) 10 MG/ML IV SOLN
10.0000 mg | Freq: Once | INTRAVENOUS | Status: AC
  Administered 2019-08-27: 21:00:00 10 mg via INTRAMUSCULAR
  Filled 2019-08-27: qty 1

## 2019-08-27 MED ORDER — LACTATED RINGERS IV SOLN: INTRAVENOUS | Status: DC

## 2019-08-27 MED ORDER — OXYTOCIN 40 UNITS IN NORMAL SALINE INFUSION - SIMPLE MED
INTRAVENOUS | Status: AC
  Filled 2019-08-27: qty 1000

## 2019-08-27 MED ORDER — ACETAMINOPHEN 325 MG PO TABS
650.0000 mg | ORAL_TABLET | ORAL | Status: DC | PRN
  Filled 2019-08-27: qty 2

## 2019-08-27 MED ORDER — TERBUTALINE SULFATE 1 MG/ML IJ SOLN
0.2500 mg | Freq: Once | INTRAMUSCULAR | Status: AC
  Administered 2019-08-27: 20:00:00 0.25 mg via SUBCUTANEOUS
  Filled 2019-08-27: qty 1

## 2019-08-27 MED ORDER — BUPIVACAINE HCL (PF) 0.5 % IJ SOLN
INTRAMUSCULAR | Status: AC
  Filled 2019-08-27: qty 30

## 2019-08-27 NOTE — OB Triage Note (Signed)
Patient presents to OBS4 w/ complaint of lower abdominal pressure.  Pain rated as 10/10 with standing, lying down 6/10.  No LOF or bleeding reported. Patient was previous cesarean section.

## 2019-08-27 NOTE — OB Triage Note (Signed)
Pt continues to complain of persistent "pressure" in lower abdomen that intensifies with occasional contractions. Pt is having occasional ctx on monitor and by palpation.  Ctx palpate mild and relax fully in between.  The pain/pressure the patient reports does seem to be constant and not related entirely to ctx.  Abdomen not tender to palpation.  Pt offered tylenol for discomfort and denies at this time.  MD aware of lab results (UA and CBC), IV bolus complete and of pt discomfort.  Will continue to monitor closely.

## 2019-08-27 NOTE — H&P (Signed)
Obstetrics Admission History & Physical   CC: Lower abdominal pain, pregnancy  HPI:  33 y.o. [id number] @ [redacted]w[redacted]d (09/22/2019, by Ultrasound). Admitted on 08/27/2019:   Patient Active Problem List   Diagnosis Date Noted  . Pregnancy related bilateral lower abdominal pain, antepartum 08/27/2019  . Supervision of high risk pregnancy, antepartum 02/21/2019  . History of cesarean delivery affecting pregnancy 02/21/2019  . Antepartum multigravida of advanced maternal age 20/05/2019  . Iron deficiency anemia 04/03/2018  . Mixed hyperlipidemia 04/03/2018  . Vitamin D deficiency 04/03/2018  . Impaired fasting glucose 04/03/2018  . Partial molar pregnancy 11/17/2017  . History of ectopic pregnancy 06/23/2016    Presents for lower abdominal pains starting this afternoon, radiates to back, moderate to severe, no modifiers or associated sx's, context of pregnancy and prior CS .  Prior CS 9 years ago for breech, planing repeat CS at 39 weeks.   Prenatal care at: at [facility]. Pregnancy complicated by none.  ROS: A review of systems was performed and negative, except as stated in the above HPI. PMHx:  Past Medical History:  Diagnosis Date  . Infertility associated with anovulation   . Ovarian cyst    PSHx:  Past Surgical History:  Procedure Laterality Date  . CESAREAN SECTION  2011  . DILATION AND EVACUATION N/A 11/12/2017   Procedure: DILATATION AND EVACUATION;  Surgeon: Malachy Mood, MD;  Location: [facility] ORS;  Service: Gynecology;  Laterality: N/A;   Medications:  Medications Prior to Admission  Medication Sig Dispense Refill Last Dose  . Calcium Carbonate-Vit D-Min (CALCIUM 1200 PO) Take by mouth.   08/27/2019 at 0900  . Omega-3 Fatty Acids (FISH OIL) 1000 MG CAPS Take by mouth.   08/27/2019 at 0900  . PRENATAL 28-0.8 MG TABS Take by mouth.   08/27/2019 at 0900  . vitamin C (ASCORBIC ACID) 250 MG tablet Take 500 mg by mouth every other day.   08/27/2019 at 0900  . docusate sodium  (COLACE) 100 MG capsule Take 1 capsule (100 mg total) by mouth 2 (two) times daily as needed. (Patient not taking: Reported on 02/21/2019) 30 capsule 2 Unknown at Unknown time  . ondansetron (ZOFRAN ODT) 4 MG disintegrating tablet Take 1 tablet (4 mg total) by mouth every 6 (six) hours as needed for nausea. (Patient not taking: Reported on 04/05/2019) 30 tablet 3 Unknown at Unknown time   Allergies: has No Known Allergies. OBHx:  OB History  Gravida Para Term Preterm AB Living  5 1 1   3 1   SAB TAB Ectopic Multiple Live Births  0   2   1    # Outcome Date GA Lbr Len/2nd Weight Sex Delivery Anes PTL Lv  5 Current           4 Molar 11/12/17             Complications: Partial molar pregnancy  3 Term 2011 [redacted]w[redacted]d  3175 g M CS-Unspec   LIV     Complications: Breech presentation  2 Ectopic           1 Ectopic            ZHG:DJMEQAST/MHDQQIWLNLGX except as detailed in HPI.Marland Kitchen  No family history of birth defects. Soc Hx: Alcohol: none and Recreational drug use: none  Objective:   Vitals:   08/27/19 1625  BP: 120/79  Pulse: (!) 107  Resp: 16  Temp: 98.5 F (36.9 C)   Constitutional: Well nourished, well developed female in no acute distress.  HEENT:  normal Skin: Warm and dry.  Cardiovascular:Regular rate and rhythm.   Extremity: trace to 1+ bilateral pedal edema Respiratory: Clear to auscultation bilateral. Normal respiratory effort Abdomen: gravid, ND, FHT present, moderate tenderness on exam Back: no CVAT Neuro: DTRs 2+, Cranial nerves grossly intact Psych: Alert and Oriented x3. No memory deficits. Normal mood and affect.  MS: normal gait, normal bilateral lower extremity ROM/strength/stability.  Pelvic exam: is not limited by body habitus EGBUS: within normal limits Vagina: within normal limits and with normal mucosa Cervix: CERVIX: FT cm dilated, 80 effaced, -3 station    VTX Uterus: Uterus demonstrates irritability pattern. and no ctxs  Adnexa: not evaluated  EFM:FHR: 1450  bpm, variability: moderate,  accelerations:  Present,  decelerations:  Absent Toco: None   Perinatal info:  Blood type: A positive Rubella- Immune Varicella -Immune TDaP Given during third trimester of this pregnancy RPR NR / HIV Neg/ HBsAg Neg   Assessment & Plan:   33 y.o. [id number] @ [redacted]w[redacted]d, Admitted on 08/27/2019: Lower abdominal pains with no cervical change or ctxs.  Monitor for longer time period.  Hydration. CBC. UA. CS options discussed if in early PTL.   Annamarie Major, MD, [facility], [facility] 08/27/2019  5:57 PM

## 2019-08-27 NOTE — Progress Notes (Signed)
  Labor Progress Note   33 y.o. [id number] @ [redacted]w[redacted]d , admitted for  Pregnancy, Labor Management.   Subjective:  Pt having ctx pains again, after a rest with Terb and Morphine injection.  Objective:  BP 127/79   Pulse 92   Temp 98.5 F (36.9 C) (Oral)   Resp 16   Ht 4\' 11"  (1.499 m)   Wt 76.7 kg   LMP 12/05/2018 (Exact Date)   BMI 34.13 kg/m  Abd: gravid, ND, FHT present, moderate tenderness on exam Extr: trace to 1+ bilateral pedal edema FHT 140s Ctx every 7-10 min, lasting 60-18 sec  Assessment & Plan:  [id number] @ [redacted]w[redacted]d, admitted for  Pregnancy and Labor/Delivery Management  1. Pain management: none. 2. FWB: FHT category 1.  3. ID: GBS not done 4. Labor management: Pt continues with very painful contractions, and despite no dilation of cervix she seems to be in early labor despite all of our measures to stop the contractions.  Although the terbutaline and morphine helped for a little while, they have since returned similarly to before, in fact more intense per pt.  Options discussed, and she prefers to proceed with cesarean delivery. Risks of surgery and of prematurity d/w pt. Neonatal nurse has also been in with patient to discuss 36 week risks. Patient has been counseled as to the risks of prematurity, including risks of respiratory depression or distress, jaundice, feeding or temperature regulation problems, neurologic concerns including hearing or visual problems, and brain complications.  Patient understands the risks of tocolytic therapies along with their inherent failure rates, and the reasons for and against their use in individual circumstances.  Will perform Covid test prior to doing the surgery  All discussed with patient, see orders  Barnett Applebaum, MD, [facility], [facility] 08/27/2019  11:59 PM

## 2019-08-28 ENCOUNTER — Encounter: Payer: Self-pay | Admitting: Anesthesiology

## 2019-08-28 ENCOUNTER — Inpatient Hospital Stay: Payer: 59

## 2019-08-28 ENCOUNTER — Observation Stay: Payer: 59 | Admitting: Anesthesiology

## 2019-08-28 ENCOUNTER — Encounter: Payer: Self-pay | Admitting: Obstetrics and Gynecology

## 2019-08-28 ENCOUNTER — Telehealth: Payer: Self-pay | Admitting: Obstetrics & Gynecology

## 2019-08-28 ENCOUNTER — Encounter
Admission: EM | Disposition: A | Discharge status: Home / Self Care | Payer: Self-pay | Source: Home / Self Care | Attending: Obstetrics & Gynecology

## 2019-08-28 DIAGNOSIS — Z3490 Encounter for supervision of normal pregnancy, unspecified, unspecified trimester: Secondary | ICD-10-CM

## 2019-08-28 DIAGNOSIS — B951 Streptococcus, group B, as the cause of diseases classified elsewhere: Secondary | ICD-10-CM | POA: Insufficient documentation

## 2019-08-28 DIAGNOSIS — G8918 Other acute postprocedural pain: Secondary | ICD-10-CM | POA: Diagnosis not present

## 2019-08-28 DIAGNOSIS — R918 Other nonspecific abnormal finding of lung field: Secondary | ICD-10-CM | POA: Diagnosis not present

## 2019-08-28 DIAGNOSIS — O34219 Maternal care for unspecified type scar from previous cesarean delivery: Secondary | ICD-10-CM | POA: Diagnosis not present

## 2019-08-28 DIAGNOSIS — R0682 Tachypnea, not elsewhere classified: Secondary | ICD-10-CM | POA: Diagnosis not present

## 2019-08-28 DIAGNOSIS — Z3A36 36 weeks gestation of pregnancy: Secondary | ICD-10-CM | POA: Diagnosis not present

## 2019-08-28 DIAGNOSIS — Z20828 Contact with and (suspected) exposure to other viral communicable diseases: Secondary | ICD-10-CM | POA: Diagnosis not present

## 2019-08-28 DIAGNOSIS — R55 Syncope and collapse: Secondary | ICD-10-CM | POA: Diagnosis not present

## 2019-08-28 DIAGNOSIS — Z98891 History of uterine scar from previous surgery: Secondary | ICD-10-CM

## 2019-08-28 DIAGNOSIS — R1084 Generalized abdominal pain: Secondary | ICD-10-CM | POA: Diagnosis not present

## 2019-08-28 DIAGNOSIS — D649 Anemia, unspecified: Secondary | ICD-10-CM | POA: Diagnosis not present

## 2019-08-28 DIAGNOSIS — O9089 Other complications of the puerperium, not elsewhere classified: Secondary | ICD-10-CM | POA: Diagnosis not present

## 2019-08-28 DIAGNOSIS — R1030 Lower abdominal pain, unspecified: Secondary | ICD-10-CM | POA: Diagnosis not present

## 2019-08-28 LAB — COMPREHENSIVE METABOLIC PANEL
ALT: 10 U/L (ref 0–44)
AST: 21 U/L (ref 15–41)
Albumin: 2.7 g/dL — ABNORMAL LOW (ref 3.5–5.0)
Alkaline Phosphatase: 103 U/L (ref 38–126)
Anion gap: 9 (ref 5–15)
BUN: 7 mg/dL (ref 6–20)
CO2: 21 mmol/L — ABNORMAL LOW (ref 22–32)
Calcium: 8.2 mg/dL — ABNORMAL LOW (ref 8.9–10.3)
Chloride: 105 mmol/L (ref 98–111)
Creatinine, Ser: 0.48 mg/dL (ref 0.44–1.00)
GFR calc Af Amer: >60 mL/min (ref >60)
GFR calc non Af Amer: >60 mL/min (ref >60)
Glucose, Bld: 124 mg/dL — ABNORMAL HIGH (ref 70–99)
Potassium: 3.6 mmol/L (ref 3.5–5.1)
Sodium: 135 mmol/L (ref 135–145)
Total Bilirubin: 0.5 mg/dL (ref 0.3–1.2)
Total Protein: 5.6 g/dL — ABNORMAL LOW (ref 6.5–8.1)

## 2019-08-28 LAB — APTT: aPTT: 28 seconds (ref 24–36)

## 2019-08-28 LAB — FIBRINOGEN: Fibrinogen: 540 mg/dL — ABNORMAL HIGH (ref 210–475)

## 2019-08-28 LAB — TYPE AND SCREEN
ABO/RH(D): A POS
Antibody Screen: NEGATIVE

## 2019-08-28 LAB — FIBRIN DERIVATIVES D-DIMER (ARMC ONLY): Fibrin derivatives D-dimer (ARMC): >10000 ng/mL (FEU) — ABNORMAL HIGH (ref 0.00–499.00)

## 2019-08-28 LAB — LACTATE DEHYDROGENASE: LDH: 155 U/L (ref 98–192)

## 2019-08-28 LAB — PROTIME-INR
INR: 1.1 (ref 0.8–1.2)
Prothrombin Time: 14 seconds (ref 11.4–15.2)

## 2019-08-28 LAB — CBC
HCT: 34.4 % — ABNORMAL LOW (ref 36.0–46.0)
Hemoglobin: 11.2 g/dL — ABNORMAL LOW (ref 12.0–15.0)
MCH: 28.6 pg (ref 26.0–34.0)
MCHC: 32.6 g/dL (ref 30.0–36.0)
MCV: 87.8 fL (ref 80.0–100.0)
Platelets: 198 10*3/uL (ref 150–400)
RBC: 3.92 MIL/uL (ref 3.87–5.11)
RDW: 13.9 % (ref 11.5–15.5)
WBC: 13.5 10*3/uL — ABNORMAL HIGH (ref 4.0–10.5)
nRBC: 0 % (ref 0.0–0.2)

## 2019-08-28 LAB — SARS CORONAVIRUS 2 BY RT PCR (HOSPITAL ORDER, PERFORMED IN ~~LOC~~ HOSPITAL LAB): SARS Coronavirus 2: NEGATIVE

## 2019-08-28 LAB — SARS CORONAVIRUS 2 (TAT 6-24 HRS): SARS Coronavirus 2: NEGATIVE

## 2019-08-28 LAB — GLUCOSE, CAPILLARY: Glucose-Capillary: 126 mg/dL — ABNORMAL HIGH (ref 70–99)

## 2019-08-28 SURGERY — Surgical Case
Anesthesia: Spinal

## 2019-08-28 MED ORDER — SIMETHICONE 80 MG PO CHEW: 80.0000 mg | CHEWABLE_TABLET | ORAL | Status: DC | PRN

## 2019-08-28 MED ORDER — KETOROLAC TROMETHAMINE 30 MG/ML IJ SOLN
INTRAMUSCULAR | Status: DC | PRN
  Administered 2019-08-28: 30 mg via INTRAVENOUS

## 2019-08-28 MED ORDER — ACETAMINOPHEN 325 MG PO TABS: 650.0000 mg | ORAL_TABLET | Freq: Four times a day (QID) | ORAL | Status: DC

## 2019-08-28 MED ORDER — IBUPROFEN 800 MG PO TABS
800.0000 mg | ORAL_TABLET | Freq: Four times a day (QID) | ORAL | Status: DC
  Administered 2019-08-29 – 2019-08-30 (×5): 800 mg via ORAL
  Filled 2019-08-28 (×5): qty 1

## 2019-08-28 MED ORDER — DIPHENHYDRAMINE HCL 25 MG PO CAPS: 25.0000 mg | ORAL_CAPSULE | Freq: Four times a day (QID) | ORAL | Status: DC | PRN

## 2019-08-28 MED ORDER — DIPHENHYDRAMINE HCL 25 MG PO CAPS: 25.0000 mg | ORAL_CAPSULE | ORAL | Status: DC | PRN

## 2019-08-28 MED ORDER — ONDANSETRON HCL 4 MG/2ML IJ SOLN: 4.0000 mg | Freq: Three times a day (TID) | INTRAMUSCULAR | Status: DC | PRN

## 2019-08-28 MED ORDER — NALBUPHINE HCL 10 MG/ML IJ SOLN: 5.0000 mg | Freq: Once | INTRAMUSCULAR | Status: DC | PRN

## 2019-08-28 MED ORDER — MENTHOL 3 MG MT LOZG
1.0000 | LOZENGE | OROMUCOSAL | Status: DC | PRN
  Filled 2019-08-28: qty 9

## 2019-08-28 MED ORDER — BUPIVACAINE 0.25 % ON-Q PUMP DUAL CATH 400 ML
400.0000 mL | INJECTION | Status: DC
  Filled 2019-08-28: qty 400

## 2019-08-28 MED ORDER — COCONUT OIL OIL: 1.0000 "application " | TOPICAL_OIL | Status: DC | PRN

## 2019-08-28 MED ORDER — MORPHINE SULFATE (PF) 0.5 MG/ML IJ SOLN
INTRAMUSCULAR | Status: DC | PRN
  Administered 2019-08-28: .1 mg via INTRATHECAL

## 2019-08-28 MED ORDER — OXYCODONE HCL 5 MG PO TABS: 5.0000 mg | ORAL_TABLET | Freq: Once | ORAL | Status: DC | PRN

## 2019-08-28 MED ORDER — PRENATAL MULTIVITAMIN CH
1.0000 | ORAL_TABLET | Freq: Every day | ORAL | Status: DC
  Administered 2019-08-29 – 2019-08-30 (×2): 1 via ORAL
  Filled 2019-08-28 (×2): qty 1

## 2019-08-28 MED ORDER — MORPHINE SULFATE (PF) 0.5 MG/ML IJ SOLN
INTRAMUSCULAR | Status: AC
  Filled 2019-08-28: qty 10

## 2019-08-28 MED ORDER — KETOROLAC TROMETHAMINE 30 MG/ML IJ SOLN: 30.0000 mg | Freq: Four times a day (QID) | INTRAMUSCULAR | Status: AC

## 2019-08-28 MED ORDER — AMMONIA AROMATIC IN INHA
RESPIRATORY_TRACT | Status: AC
  Filled 2019-08-28: qty 10

## 2019-08-28 MED ORDER — NALBUPHINE HCL 10 MG/ML IJ SOLN: 5.0000 mg | INTRAMUSCULAR | Status: DC | PRN

## 2019-08-28 MED ORDER — ONDANSETRON HCL 4 MG/2ML IJ SOLN
INTRAMUSCULAR | Status: DC | PRN
  Administered 2019-08-28: 4 mg via INTRAVENOUS

## 2019-08-28 MED ORDER — SODIUM CHLORIDE 0.9 % IV SOLN
2.0000 g | INTRAVENOUS | Status: AC
  Administered 2019-08-28: 2 g via INTRAVENOUS

## 2019-08-28 MED ORDER — BUPIVACAINE 0.25 % ON-Q PUMP DUAL CATH 400 ML: 400.0000 mL | INJECTION | Status: DC

## 2019-08-28 MED ORDER — SODIUM CHLORIDE 0.9 % IV SOLN
INTRAVENOUS | Status: AC
  Filled 2019-08-28: qty 2

## 2019-08-28 MED ORDER — BUPIVACAINE ON-Q PAIN PUMP (FOR ORDER SET NO CHG): INJECTION | Status: DC

## 2019-08-28 MED ORDER — KETOROLAC TROMETHAMINE 30 MG/ML IJ SOLN: 30.0000 mg | Freq: Four times a day (QID) | INTRAMUSCULAR | Status: DC

## 2019-08-28 MED ORDER — SOD CITRATE-CITRIC ACID 500-334 MG/5ML PO SOLN
30.0000 mL | ORAL | Status: AC
  Administered 2019-08-28: 03:00:00 30 mL via ORAL

## 2019-08-28 MED ORDER — FENTANYL CITRATE (PF) 100 MCG/2ML IJ SOLN: 25.0000 ug | INTRAMUSCULAR | Status: DC | PRN

## 2019-08-28 MED ORDER — EPHEDRINE SULFATE 50 MG/ML IJ SOLN
INTRAMUSCULAR | Status: DC | PRN
  Administered 2019-08-28: 50 mg via INTRAMUSCULAR

## 2019-08-28 MED ORDER — SIMETHICONE 80 MG PO CHEW
80.0000 mg | CHEWABLE_TABLET | Freq: Three times a day (TID) | ORAL | Status: DC
  Administered 2019-08-29 – 2019-08-30 (×5): 80 mg via ORAL
  Filled 2019-08-28 (×3): qty 1

## 2019-08-28 MED ORDER — BUPIVACAINE HCL 0.5 % IJ SOLN
10.0000 mL | Freq: Once | INTRAMUSCULAR | Status: DC
  Filled 2019-08-28: qty 10

## 2019-08-28 MED ORDER — IOHEXOL 350 MG/ML SOLN
75.0000 mL | Freq: Once | INTRAVENOUS | Status: AC | PRN
  Administered 2019-08-28: 06:00:00 75 mL via INTRAVENOUS

## 2019-08-28 MED ORDER — ONDANSETRON HCL 4 MG/2ML IJ SOLN
INTRAMUSCULAR | Status: AC
  Filled 2019-08-28: qty 2

## 2019-08-28 MED ORDER — NALOXONE HCL 0.4 MG/ML IJ SOLN: 0.4000 mg | INTRAMUSCULAR | Status: DC | PRN

## 2019-08-28 MED ORDER — NALOXONE HCL 4 MG/10ML IJ SOLN
1.0000 ug/kg/h | INTRAVENOUS | Status: DC | PRN
  Filled 2019-08-28: qty 5

## 2019-08-28 MED ORDER — OXYTOCIN 40 UNITS IN NORMAL SALINE INFUSION - SIMPLE MED
2.5000 [IU]/h | INTRAVENOUS | Status: DC
  Filled 2019-08-28: qty 1000

## 2019-08-28 MED ORDER — OXYTOCIN 40 UNITS IN NORMAL SALINE INFUSION - SIMPLE MED
INTRAVENOUS | Status: DC | PRN
  Administered 2019-08-28: 1000 mL via INTRAVENOUS

## 2019-08-28 MED ORDER — FENTANYL CITRATE (PF) 100 MCG/2ML IJ SOLN
INTRAMUSCULAR | Status: AC
  Filled 2019-08-28: qty 2

## 2019-08-28 MED ORDER — EPHEDRINE SULFATE 50 MG/ML IJ SOLN
INTRAMUSCULAR | Status: AC
  Filled 2019-08-28: qty 1

## 2019-08-28 MED ORDER — WITCH HAZEL-GLYCERIN EX PADS: 1.0000 "application " | MEDICATED_PAD | CUTANEOUS | Status: DC | PRN

## 2019-08-28 MED ORDER — OXYCODONE HCL 5 MG/5ML PO SOLN
5.0000 mg | Freq: Once | ORAL | Status: DC | PRN
  Filled 2019-08-28: qty 5

## 2019-08-28 MED ORDER — ACETAMINOPHEN 325 MG PO TABS: 650.0000 mg | ORAL_TABLET | ORAL | Status: DC | PRN

## 2019-08-28 MED ORDER — KETOROLAC TROMETHAMINE 30 MG/ML IJ SOLN
INTRAMUSCULAR | Status: AC
  Filled 2019-08-28: qty 1

## 2019-08-28 MED ORDER — SODIUM CHLORIDE 0.9% FLUSH: 3.0000 mL | INTRAVENOUS | Status: DC | PRN

## 2019-08-28 MED ORDER — SODIUM CHLORIDE 0.9 % IV SOLN
INTRAVENOUS | Status: DC | PRN
  Administered 2019-08-28: 50 ug/min via INTRAVENOUS

## 2019-08-28 MED ORDER — SIMETHICONE 80 MG PO CHEW
80.0000 mg | CHEWABLE_TABLET | ORAL | Status: DC
  Filled 2019-08-28 (×2): qty 1

## 2019-08-28 MED ORDER — MORPHINE SULFATE (PF) 2 MG/ML IV SOLN: 1.0000 mg | INTRAVENOUS | Status: DC | PRN

## 2019-08-28 MED ORDER — SENNOSIDES-DOCUSATE SODIUM 8.6-50 MG PO TABS
2.0000 | ORAL_TABLET | ORAL | Status: DC
  Administered 2019-08-29 – 2019-08-30 (×2): 2 via ORAL
  Filled 2019-08-28 (×2): qty 2

## 2019-08-28 MED ORDER — ZOLPIDEM TARTRATE 5 MG PO TABS: 5.0000 mg | ORAL_TABLET | Freq: Every evening | ORAL | Status: DC | PRN

## 2019-08-28 MED ORDER — TETANUS-DIPHTH-ACELL PERTUSSIS 5-2.5-18.5 LF-MCG/0.5 IM SUSP
0.5000 mL | Freq: Once | INTRAMUSCULAR | Status: DC
  Filled 2019-08-28: qty 0.5

## 2019-08-28 MED ORDER — PHENYLEPHRINE HCL (PRESSORS) 10 MG/ML IV SOLN
INTRAVENOUS | Status: DC | PRN
  Administered 2019-08-28: 200 ug via INTRAVENOUS

## 2019-08-28 MED ORDER — OXYCODONE-ACETAMINOPHEN 5-325 MG PO TABS: 1.0000 | ORAL_TABLET | ORAL | Status: DC | PRN

## 2019-08-28 MED ORDER — BUPIVACAINE IN DEXTROSE 0.75-8.25 % IT SOLN
INTRATHECAL | Status: DC | PRN
  Administered 2019-08-28: 1.4 mL via INTRATHECAL

## 2019-08-28 MED ORDER — LACTATED RINGERS IV SOLN
Freq: Once | INTRAVENOUS | Status: AC
  Administered 2019-08-28: 03:00:00 via INTRAVENOUS

## 2019-08-28 MED ORDER — DIBUCAINE (PERIANAL) 1 % EX OINT: 1.0000 "application " | TOPICAL_OINTMENT | CUTANEOUS | Status: DC | PRN

## 2019-08-28 MED ORDER — FENTANYL CITRATE (PF) 100 MCG/2ML IJ SOLN
INTRAMUSCULAR | Status: DC | PRN
  Administered 2019-08-28: 15 ug via INTRAVENOUS

## 2019-08-28 MED ORDER — OXYCODONE HCL 5 MG PO TABS: 5.0000 mg | ORAL_TABLET | Freq: Four times a day (QID) | ORAL | Status: DC | PRN

## 2019-08-28 MED ORDER — LACTATED RINGERS IV SOLN: INTRAVENOUS | Status: DC

## 2019-08-28 MED ORDER — DIPHENHYDRAMINE HCL 50 MG/ML IJ SOLN: 12.5000 mg | INTRAMUSCULAR | Status: DC | PRN

## 2019-08-28 MED ORDER — PHENYLEPHRINE HCL (PRESSORS) 10 MG/ML IV SOLN
INTRAVENOUS | Status: AC
  Filled 2019-08-28: qty 1

## 2019-08-28 SURGICAL SUPPLY — 25 items
BARRIER ADHS 3X4 INTERCEED (GAUZE/BANDAGES/DRESSINGS) ×3 IMPLANT
CANISTER SUCT 3000ML PPV (MISCELLANEOUS) ×3 IMPLANT
CATH KIT ON-Q SILVERSOAK 5IN (CATHETERS) ×6 IMPLANT
CHLORAPREP W/TINT 26 (MISCELLANEOUS) ×6 IMPLANT
COVER WAND RF STERILE (DRAPES) ×3 IMPLANT
DERMABOND ADVANCED (GAUZE/BANDAGES/DRESSINGS) ×2
DERMABOND ADVANCED .7 DNX12 (GAUZE/BANDAGES/DRESSINGS) ×1 IMPLANT
ELECT CAUTERY BLADE 6.4 (BLADE) ×3 IMPLANT
ELECT REM PT RETURN 9FT ADLT (ELECTROSURGICAL) ×3
ELECTRODE REM PT RTRN 9FT ADLT (ELECTROSURGICAL) ×1 IMPLANT
GLOVE SKINSENSE NS SZ8.0 LF (GLOVE) ×2
GLOVE SKINSENSE STRL SZ8.0 LF (GLOVE) ×1 IMPLANT
GOWN STRL REUS W/ TWL LRG LVL3 (GOWN DISPOSABLE) ×1 IMPLANT
GOWN STRL REUS W/ TWL XL LVL3 (GOWN DISPOSABLE) ×2 IMPLANT
GOWN STRL REUS W/TWL LRG LVL3 (GOWN DISPOSABLE) ×2
GOWN STRL REUS W/TWL XL LVL3 (GOWN DISPOSABLE) ×4
NS IRRIG 1000ML POUR BTL (IV SOLUTION) ×3 IMPLANT
PACK C SECTION AR (MISCELLANEOUS) ×3 IMPLANT
PAD OB MATERNITY 4.3X12.25 (PERSONAL CARE ITEMS) ×3 IMPLANT
PAD PREP 24X41 OB/GYN DISP (PERSONAL CARE ITEMS) ×3 IMPLANT
PENCIL SMOKE ULTRAEVAC 22 CON (MISCELLANEOUS) ×3 IMPLANT
SUT MAXON ABS #0 GS21 30IN (SUTURE) ×6 IMPLANT
SUT VIC AB 1 CT1 36 (SUTURE) ×9 IMPLANT
SUT VIC AB 2-0 CT1 36 (SUTURE) ×3 IMPLANT
SUT VIC AB 4-0 FS2 27 (SUTURE) ×3 IMPLANT

## 2019-08-28 NOTE — Telephone Encounter (Signed)
All schedule appointment are cancelled. Left voicemail for patient to call back to confirm new schedule appointment. For Monday, 09/04/19 at 1:50 with [facility]

## 2019-08-28 NOTE — Progress Notes (Signed)
RCP responded to rapid response. Performed EKG & ABG. No other intervention required.

## 2019-08-28 NOTE — Discharge Summary (Signed)
OB Discharge Summary     Patient Name: Destiny Allen DOB: 1986-08-19 MRN: [id number]  Date of admission: 08/27/2019 Delivering MD: Hoyt Koch, MD  Date of Delivery: 08/28/2019  Date of discharge: 08/30/2019  Admitting diagnosis: Pregnant Abd pain Intrauterine pregnancy: [redacted]w[redacted]d     Secondary diagnosis: Preterm Labor, Prior Cesarean     Discharge diagnosis: Preterm Pregnancy Delivered, Reasons for cesarean section  Elective repeat                         Hospital course:  Onset of Labor With Unplanned C/S  [name] at [redacted]w[redacted]d was admitted in Latent Labor on 08/27/2019. Patient had a labor course significant for monitoring and meds to see if PTL would resolve.  The patient went for cesarean section due to continued contractions and pain despite adequate therapy, and due to prior h/o CS, and delivered a Viable infant,08/28/2019  Details of operation can be found in separate operative note. Patient had a postpartum course complicated by an episode of unresponsiveness on post op day 1. She had 2 syncopal episodes on day 1. Chest x-ray and CT were negative for PE. On day 2 she admits feeling well and has had no further episodes of syncopy. She is tolerating a regular diet, her pain is controlled with PO medications. She is ambulating and voiding without difficulty.  Patient is discharged home in stable condition 08/30/19.                                                                 Post partum procedures: chest x-ray and CT to rule out PE   CLINICAL DATA:  Tachypnea  EXAM: PORTABLE CHEST 1 VIEW  COMPARISON:  None.  FINDINGS: Mild airways thickening is present with some hazy interstitial opacities most pronounced in the left lung. No pneumothorax or effusion. Cardiomediastinal contours are unremarkable. No acute osseous or soft tissue abnormality.  IMPRESSION: Airway thickening with some hazy interstitial opacities, consistent with airways inflammation likely with  some atelectasis.   Electronically Signed   By: Lovena Le M.D.   On: 08/28/2019 05:57 __________________________________________________________________________  CLINICAL DATA:  Tachypnea.  C-section 2 hours ago  EXAM: CT ANGIOGRAPHY CHEST WITH CONTRAST  TECHNIQUE: Multidetector CT imaging of the chest was performed using the standard protocol during bolus administration of intravenous contrast. Multiplanar CT image reconstructions and MIPs were obtained to evaluate the vascular anatomy.  CONTRAST:  84mL OMNIPAQUE IOHEXOL 350 MG/ML SOLN  COMPARISON:  Chest x-ray from earlier today  FINDINGS: Cardiovascular: Normal heart size. No pericardial effusion. No pulmonary artery filling defect. Normal aorta.  Mediastinum/Nodes: Negative for adenopathy or mass.  Lungs/Pleura: Mild dependent atelectasis. There is no edema, consolidation, effusion, or pneumothorax.  Upper Abdomen: Small cystic density in the liver.  Musculoskeletal: Negative  Review of the MIP images confirms the above findings.  IMPRESSION: 1. Negative for pulmonary embolism or other acute finding. 2. Mild atelectasis.   Electronically Signed   By: Monte Fantasia M.D.   On: 08/28/2019 06:37 ___________________________________________________________________  Complications: None  Physical exam on 08/30/2019: Vitals:   08/29/19 1401 08/29/19 1928 08/29/19 2301 08/30/19 0816  BP:  101/70 108/75 109/71  Pulse:  97 81 79  Resp: 20 18 18  18  Temp:  98.7 F (37.1 C) 97.9 F (36.6 C) 98 F (36.7 C)  TempSrc:  Oral Oral Oral  SpO2:  97% 97% 98%  Weight:      Height:       General: alert, cooperative and no distress Lochia: appropriate Uterine Fundus: firm Incision: Healing well with no significant drainage, Dressing is clean, dry, and intact DVT Evaluation: No evidence of DVT seen on physical exam.  Labs: Lab Results  Component Value Date   WBC 7.3 08/30/2019   HGB 9.6 (L)  08/30/2019   HCT 30.1 (L) 08/30/2019   MCV 88.8 08/30/2019   PLT 207 08/30/2019   CMP Latest Ref Rng & Units 08/28/2019  Glucose 70 - 99 mg/dL 240(X)  BUN 6 - 20 mg/dL 7  Creatinine 7.35 - 3.29 mg/dL 9.24  Sodium 268 - 341 mmol/L 135  Potassium 3.5 - 5.1 mmol/L 3.6  Chloride 98 - 111 mmol/L 105  CO2 22 - 32 mmol/L 21(L)  Calcium 8.9 - 10.3 mg/dL 8.2(L)  Total Protein 6.5 - 8.1 g/dL 9.6(Q)  Total Bilirubin 0.3 - 1.2 mg/dL 0.5  Alkaline Phos 38 - 126 U/L 103  AST 15 - 41 U/L 21  ALT 0 - 44 U/L 10    Discharge instruction: per After Visit Summary.  Medications:  Allergies as of 08/30/2019   No Known Allergies     Medication List    STOP taking these medications   docusate sodium 100 MG capsule Commonly known as: COLACE   ondansetron 4 MG disintegrating tablet Commonly known as: Zofran ODT     TAKE these medications   CALCIUM 1200 PO Take by mouth.   Fish Oil 1000 MG Caps Take by mouth.   oxyCODONE 5 MG immediate release tablet Commonly known as: Roxicodone Take 1 tablet (5 mg total) by mouth every 6 (six) hours as needed for up to 5 days for severe pain.   Prenatal 28-0.8 MG Tabs Take by mouth.   vitamin C 250 MG tablet Commonly known as: ASCORBIC ACID Take 500 mg by mouth every other day.            Discharge Care Instructions  (From admission, onward)         Start     Ordered   08/30/19 0000  Discharge wound care:    Comments: Keep incision dry, clean.   08/30/19 1136          Diet: routine diet  Activity: Advance as tolerated. Pelvic rest for 6 weeks.   Outpatient follow up: Follow-up Information    Nadara Mustard, MD. Go on 09/04/2019.   Specialty: Obstetrics and Gynecology Why: Post Op Appointment at 3:50 pm Contact information: [address] [place] [address] [phone]             Postpartum contraception: Undecided: to discuss at postpartum visit Rhogam Given postpartum: no Rubella vaccine given  postpartum: no Varicella vaccine given postpartum: no TDaP given antepartum or postpartum: Yes  Newborn Data: Live born female Liam Birth Weight: 6 lb 15.5 oz (3160 g) APGAR: 8, 9  Newborn Delivery   Birth date/time: 08/28/2019 03:21:00 Delivery type: C-Section, Low Transverse Trial of labor: Yes C-section categorization: Repeat       Baby Feeding: Breast  Disposition:home with mother  SIGNED: Tresea Mall, CNM 08/30/2019 11:37 AM

## 2019-08-28 NOTE — Anesthesia Post-op Follow-up Note (Signed)
Anesthesia QCDR form completed.        

## 2019-08-28 NOTE — Consult Note (Signed)
[facility] - [place] at [facility]   PATIENT NAME: Destiny AmorRosa Allen    MR#:  [id number]  DATE OF BIRTH:  Jan 10, 1986  DATE OF ADMISSION:  08/27/2019  PRIMARY CARE PHYSICIAN: Carlean JewsBoscia, Heather E, NP   REQUESTING/REFERRING PHYSICIAN: Dr. Velora Mediateobert Harris  CHIEF COMPLAINT:  No chief complaint on file.   HISTORY OF PRESENT ILLNESS:  Destiny Allen  is a 33 y.o. female with no significant past medical history presents to hospital at G5P1031 at [redacted] weeks gestation for lower abdominal pain.. Patient was admitted on 08/27/2019 for lower abdominal pain with painful contractions.  She had a cesarean section delivery on 08/28/2019 early hours around 4 AM.  An hour later rapid response was called by the patient was breast-feeding her baby because she started having rapid breathing and decreased responsiveness.  Medical consult was requested for the same. At the time of my exam around 8 AM, patient is alert and oriented.  All the labs and imaging studies were reviewed.  At this time considered to be vasovagal episode.  Monitor closely.  Further changes in mentation noted, would recommend CT head.  Other than headache, patient denies any nausea or vomiting.  No prior seizure history.  No witnessed seizure-like activity.  Patient does not remember breast-feeding her infant.  PAST MEDICAL HISTORY:   Past Medical History:  Diagnosis Date  . Infertility associated with anovulation   . Ovarian cyst     PAST SURGICAL HISTOIRY:   Past Surgical History:  Procedure Laterality Date  . CESAREAN SECTION  2011  . DILATION AND EVACUATION N/A 11/12/2017   Procedure: DILATATION AND EVACUATION;  Surgeon: Vena AustriaStaebler, Andreas, MD;  Location: [facility] ORS;  Service: Gynecology;  Laterality: N/A;    SOCIAL HISTORY:   Social History   Tobacco Use  . Smoking status: Never Smoker  . Smokeless tobacco: Never Used  Substance Use Topics  . Alcohol use: No    FAMILY HISTORY:   Family History   Problem Relation Age of Onset  . Hypertension Father     DRUG ALLERGIES:  No Known Allergies  REVIEW OF SYSTEMS:   Review of Systems  Constitutional: Positive for malaise/fatigue. Negative for chills, fever and weight loss.  HENT: Negative for ear discharge, ear pain, hearing loss, nosebleeds and tinnitus.   Eyes: Negative for blurred vision, double vision and photophobia.  Respiratory: Negative for cough, hemoptysis, shortness of breath and wheezing.   Cardiovascular: Negative for chest pain, palpitations, orthopnea and leg swelling.  Gastrointestinal: Negative for abdominal pain, constipation, diarrhea, heartburn, melena, nausea and vomiting.  Genitourinary: Negative for dysuria, frequency, hematuria and urgency.  Musculoskeletal: Negative for back pain, myalgias and neck pain.  Skin: Negative for rash.  Neurological: Positive for headaches. Negative for dizziness, tingling, tremors, sensory change, speech change and focal weakness.  Endo/Heme/Allergies: Does not bruise/bleed easily.  Psychiatric/Behavioral: Negative for depression.  Marland Kitchen.   MEDICATIONS AT HOME:   Prior to Admission medications   Medication Sig Start Date End Date Taking? Authorizing Provider  Calcium Carbonate-Vit D-Min (CALCIUM 1200 PO) Take by mouth.   Yes [provider]  Omega-3 Fatty Acids (FISH OIL) 1000 MG CAPS Take by mouth.   Yes [provider]  PRENATAL 28-0.8 MG TABS Take by mouth.   Yes [provider]  vitamin C (ASCORBIC ACID) 250 MG tablet Take 500 mg by mouth every other day.   Yes [provider]  docusate sodium (COLACE) 100 MG capsule Take 1 capsule (100 mg total) by  mouth 2 (two) times daily as needed. Patient not taking: Reported on 02/21/2019 01/19/19   Homero Fellers, MD  ondansetron (ZOFRAN ODT) 4 MG disintegrating tablet Take 1 tablet (4 mg total) by mouth every 6 (six) hours as needed for nausea. Patient not taking: Reported on 04/05/2019 02/21/19    Homero Fellers, MD      VITAL SIGNS:  Blood pressure (!) 101/58, pulse 78, temperature 98.2 F (36.8 C), temperature source Oral, resp. rate 16, height 4\' 11"  (1.499 m), weight 76.7 kg, last menstrual period 12/05/2018, SpO2 96 %, unknown if currently breastfeeding.  PHYSICAL EXAMINATION:   Physical Exam  GENERAL:  33 y.o.-year-old patient lying in the bed with no acute distress.  EYES: Pupils equal, round, reactive to light and accommodation. No scleral icterus. Extraocular muscles intact.  HEENT: Head atraumatic, normocephalic. Oropharynx and nasopharynx clear.  NECK:  Supple, no jugular venous distention. No thyroid enlargement, no tenderness.  LUNGS: Normal breath sounds bilaterally, no wheezing, rales,rhonchi or crepitation. No use of accessory muscles of respiration.  CARDIOVASCULAR: S1, S2 normal. No murmurs, rubs, or gallops.  ABDOMEN: Soft, distended lower abdomen, nontender.. Bowel sounds present. No organomegaly or mass.  EXTREMITIES: No pedal edema, cyanosis, or clubbing.  NEUROLOGIC: Cranial nerves II through XII are intact. Muscle strength 5/5 in all extremities.  With some giveaway deficits in both legs.  Sensation intact. Gait not checked.  PSYCHIATRIC: The patient is alert and oriented x 3.  SKIN: No obvious rash, lesion, or ulcer.   LABORATORY PANEL:   CBC Recent Labs  Lab 08/28/19 0544  WBC 13.5*  HGB 11.2*  HCT 34.4*  PLT 198   ------------------------------------------------------------------------------------------------------------------  Chemistries  Recent Labs  Lab 08/28/19 0544  NA 135  K 3.6  CL 105  CO2 21*  GLUCOSE 124*  BUN 7  CREATININE 0.48  CALCIUM 8.2*  AST 21  ALT 10  ALKPHOS 103  BILITOT 0.5   ------------------------------------------------------------------------------------------------------------------  Cardiac Enzymes No results for input(s): TROPONINI in the last 168  hours. ------------------------------------------------------------------------------------------------------------------  RADIOLOGY:  Ct Angio Chest Pe W Or Wo Contrast  Result Date: 08/28/2019 CLINICAL DATA:  Tachypnea.  C-section 2 hours ago EXAM: CT ANGIOGRAPHY CHEST WITH CONTRAST TECHNIQUE: Multidetector CT imaging of the chest was performed using the standard protocol during bolus administration of intravenous contrast. Multiplanar CT image reconstructions and MIPs were obtained to evaluate the vascular anatomy. CONTRAST:  70mL OMNIPAQUE IOHEXOL 350 MG/ML SOLN COMPARISON:  Chest x-ray from earlier today FINDINGS: Cardiovascular: Normal heart size. No pericardial effusion. No pulmonary artery filling defect. Normal aorta. Mediastinum/Nodes: Negative for adenopathy or mass. Lungs/Pleura: Mild dependent atelectasis. There is no edema, consolidation, effusion, or pneumothorax. Upper Abdomen: Small cystic density in the liver. Musculoskeletal: Negative Review of the MIP images confirms the above findings. IMPRESSION: 1. Negative for pulmonary embolism or other acute finding. 2. Mild atelectasis. Electronically Signed   By: Monte Fantasia M.D.   On: 08/28/2019 06:37   Dg Chest Port 1 View  Result Date: 08/28/2019 CLINICAL DATA:  Tachypnea EXAM: PORTABLE CHEST 1 VIEW COMPARISON:  None. FINDINGS: Mild airways thickening is present with some hazy interstitial opacities most pronounced in the left lung. No pneumothorax or effusion. Cardiomediastinal contours are unremarkable. No acute osseous or soft tissue abnormality. IMPRESSION: Airway thickening with some hazy interstitial opacities, consistent with airways inflammation likely with some atelectasis. Electronically Signed   By: Lovena Le M.D.   On: 08/28/2019 05:57    EKG:   Orders placed or performed  during the hospital encounter of 08/27/19  . EKG 12-Lead  . EKG 12-Lead    IMPRESSION AND PLAN:   Destiny Allen  is a 33 y.o. female with no  significant past medical history presents to hospital at G5P1031 at [redacted] weeks gestation for lower abdominal pain.. Medical consult requested for unresponsive episode 1 hour after her delivery.  1.  Syncopal episode-sounds vasovagal, patient was breast-feeding her infant.  She received spinal anesthesia.  No recent pain medications. -EKG done.  ABG normal.  Labs were normal as well. -CT angio negative for pulmonary embolism. -Recommend IV fluids and monitor.  If headache does not improve or continues to have wavering mental status, recommend CT head and reconsult medicine. Appears medically stable and neurologically.  Will sign off.  Please call if any questions.   All the records are reviewed and case discussed with Consulting provider. Management plans discussed with the patient, family and they are in agreement.  CODE STATUS: Full code  TOTAL TIME TAKING CARE OF THIS PATIENT: 41 minutes.    Enid Baas M.D on 08/28/2019 at 2:09 PM  Between 7am to 6pm - Pager - [phone]  After 6pm go to [email or web address] - password [email or web address] [facility]  [place] [facility]  Office  [phone]  CC: Primary care Physician: Carlean Jews, NP

## 2019-08-28 NOTE — Progress Notes (Signed)
POD#0 [id number] Subjective:  Alert and oriented, conversant, holding baby. Denies pain. Foley catheter in place.  Objective:  Blood pressure 105/63, pulse 91, temperature 98.4 F (36.9 C), temperature source Oral, resp. rate 18, height 4\' 11"  (1.499 m), weight 76.7 kg, last menstrual period 12/05/2018, SpO2 97 %, currently breastfeeding.  General: NAD Pulmonary: no increased work of breathing Abdomen: non-distended, non-tender, fundus firm and lochia appropriate Incision: Dressing C/D/I Extremities: no signs of DVT  Results for orders placed or performed during the hospital encounter of 08/27/19 (from the past 72 hour(s))  Urinalysis, Complete w Microscopic     Status: Abnormal   Collection Time: 08/27/19  5:17 PM  Result Value Ref Range   Color, Urine YELLOW (A) YELLOW   APPearance HAZY (A) CLEAR   Specific Gravity, Urine 1.011 1.005 - 1.030   pH 7.0 5.0 - 8.0   Glucose, UA NEGATIVE NEGATIVE mg/dL   Hgb urine dipstick NEGATIVE NEGATIVE   Bilirubin Urine NEGATIVE NEGATIVE   Ketones, ur NEGATIVE NEGATIVE mg/dL   Protein, ur NEGATIVE NEGATIVE mg/dL   Nitrite NEGATIVE NEGATIVE   Leukocytes,Ua NEGATIVE NEGATIVE   RBC / HPF 0-5 0 - 5 RBC/hpf   WBC, UA 0-5 0 - 5 WBC/hpf   Bacteria, UA NONE SEEN NONE SEEN   Squamous Epithelial / LPF 0-5 0 - 5   Mucus PRESENT     Comment: Performed at [facility], [address] [place], [place] [address]  Sample to Blood Bank     Status: None   Collection Time: 08/27/19  5:18 PM  Result Value Ref Range   Blood Bank Specimen SAMPLE AVAILABLE FOR TESTING    Sample Expiration      08/30/2019,2359 Performed at [facility], [address] [place], [place] [address]   Type and screen [facility]     Status: None   Collection Time: 08/27/19  5:18 PM  Result Value Ref Range   ABO/RH(D) A POS    Antibody Screen NEG    Sample Expiration      08/30/2019,2359 Performed at [facility], [address] [place], [place] [address]   CBC with Differential/Platelet     Status: Abnormal   Collection Time: 08/27/19  6:10 PM  Result Value Ref Range   WBC 8.4 4.0 - 10.5 K/uL   RBC 3.86 (L) 3.87 - 5.11 MIL/uL   Hemoglobin 11.1 (L) 12.0 - 15.0 g/dL   HCT 91.434.1 (L) 78.236.0 - 95.646.0 %   MCV 88.3 80.0 - 100.0 fL   MCH 28.8 26.0 - 34.0 pg   MCHC 32.6 30.0 - 36.0 g/dL   RDW 21.313.9 08.611.5 - 57.815.5 %   Platelets 225 150 - 400 K/uL   nRBC 0.0 0.0 - 0.2 %   Neutrophils Relative % 67 %   Neutro Abs 5.7 1.7 - 7.7 K/uL   Lymphocytes Relative 24 %   Lymphs Abs 2.0 0.7 - 4.0 K/uL   Monocytes Relative 7 %   Monocytes Absolute 0.6 0.1 - 1.0 K/uL   Eosinophils Relative 1 %   Eosinophils Absolute 0.1 0.0 - 0.5 K/uL   Basophils Relative 0 %   Basophils Absolute 0.0 0.0 - 0.1 K/uL   Immature Granulocytes 1 %   Abs Immature Granulocytes 0.06 0.00 - 0.07 K/uL    Comment: Performed at [facility], [address] [place], [place] [address]  SARS CORONAVIRUS 2 (TAT 6-24 HRS) Nasopharyngeal Nasopharyngeal Swab     Status: None  Collection Time: 08/28/19 12:01 AM   Specimen: Nasopharyngeal Swab  Result Value Ref Range   SARS Coronavirus 2 NEGATIVE NEGATIVE    Comment: (NOTE) SARS-CoV-2 target nucleic acids are NOT DETECTED. The SARS-CoV-2 RNA is generally detectable in upper and lower respiratory specimens during the acute phase of infection. Negative results do not preclude SARS-CoV-2 infection, do not rule out co-infections with other pathogens, and should not be used as the sole basis for treatment or other patient management decisions. Negative results must be combined with clinical observations, patient history, and epidemiological information. The expected result is Negative. Fact Sheet for Patients: HairSlick.no Fact Sheet for Healthcare Providers: quierodirigir.com This test is not yet approved or cleared by the [place] FDA and  has been  authorized for detection and/or diagnosis of SARS-CoV-2 by FDA under an Emergency Use Authorization (EUA). This EUA will remain  in effect (meaning this test can be used) for the duration of the COVID-19 declaration under Section 56 4(b)(1) of the Act, 21 U.S.C. section 360bbb-3(b)(1), unless the authorization is terminated or revoked sooner. Performed at [facility], [address], [place], [place] [address]   SARS Coronavirus 2 by RT PCR (hospital order, performed in [facility] hospital lab) Nasopharyngeal Nasopharyngeal Swab     Status: None   Collection Time: 08/28/19  1:11 AM   Specimen: Nasopharyngeal Swab  Result Value Ref Range   SARS Coronavirus 2 NEGATIVE NEGATIVE    Comment: (NOTE) If result is NEGATIVE SARS-CoV-2 target nucleic acids are NOT DETECTED. The SARS-CoV-2 RNA is generally detectable in upper and lower  respiratory specimens during the acute phase of infection. The lowest  concentration of SARS-CoV-2 viral copies this assay can detect is 250  copies / mL. A negative result does not preclude SARS-CoV-2 infection  and should not be used as the sole basis for treatment or other  patient management decisions.  A negative result may occur with  improper specimen collection / handling, submission of specimen other  than nasopharyngeal swab, presence of viral mutation(s) within the  areas targeted by this assay, and inadequate number of viral copies  (<250 copies / mL). A negative result must be combined with clinical  observations, patient history, and epidemiological information. If result is POSITIVE SARS-CoV-2 target nucleic acids are DETECTED. The SARS-CoV-2 RNA is generally detectable in upper and lower  respiratory specimens dur ing the acute phase of infection.  Positive  results are indicative of active infection with SARS-CoV-2.  Clinical  correlation with patient history and other diagnostic information is  necessary to determine patient  infection status.  Positive results do  not rule out bacterial infection or co-infection with other viruses. If result is PRESUMPTIVE POSTIVE SARS-CoV-2 nucleic acids MAY BE PRESENT.   A presumptive positive result was obtained on the submitted specimen  and confirmed on repeat testing.  While 2019 novel coronavirus  (SARS-CoV-2) nucleic acids may be present in the submitted sample  additional confirmatory testing may be necessary for epidemiological  and / or clinical management purposes  to differentiate between  SARS-CoV-2 and other Sarbecovirus currently known to infect humans.  If clinically indicated additional testing with an alternate test  methodology [phone]) is advised. The SARS-CoV-2 RNA is generally  detectable in upper and lower respiratory sp ecimens during the acute  phase of infection. The expected result is Negative. Fact Sheet for Patients:  BoilerBrush.com.cy Fact Sheet for Healthcare Providers: https://pope.com/ This test is not yet approved or cleared by the [place]  [place] FDA and has been authorized for detection and/or diagnosis of SARS-CoV-2 by FDA under an Emergency Use Authorization (EUA).  This EUA will remain in effect (meaning this test can be used) for the duration of the COVID-19 declaration under Section 564(b)(1) of the Act, 21 U.S.C. section 360bbb-3(b)(1), unless the authorization is terminated or revoked sooner. Performed at [facility], [place], [place] [address]   Glucose, capillary     Status: Abnormal   Collection Time: 08/28/19  5:13 AM  Result Value Ref Range   Glucose-Capillary 126 (H) 70 - 99 mg/dL  Protime-INR     Status: None   Collection Time: 08/28/19  5:44 AM  Result Value Ref Range   Prothrombin Time 14.0 11.4 - 15.2 seconds   INR 1.1 0.8 - 1.2    Comment: (NOTE) INR goal varies based on device and disease states. Performed at [facility], [address] [place], [place] [address]   APTT     Status: None   Collection Time: 08/28/19  5:44 AM  Result Value Ref Range   aPTT 28 24 - 36 seconds    Comment: Performed at [facility], [address] [place], [place] [address]  CBC     Status: Abnormal   Collection Time: 08/28/19  5:44 AM  Result Value Ref Range   WBC 13.5 (H) 4.0 - 10.5 K/uL   RBC 3.92 3.87 - 5.11 MIL/uL   Hemoglobin 11.2 (L) 12.0 - 15.0 g/dL   HCT 34.4 (L) 36.0 - 46.0 %   MCV 87.8 80.0 - 100.0 fL   MCH 28.6 26.0 - 34.0 pg   MCHC 32.6 30.0 - 36.0 g/dL   RDW 13.9 11.5 - 15.5 %   Platelets 198 150 - 400 K/uL   nRBC 0.0 0.0 - 0.2 %    Comment: Performed at [facility], [address] [place], [place] [address]  Fibrin derivatives [facility] only)     Status: Abnormal   Collection Time: 08/28/19  5:44 AM  Result Value Ref Range   Fibrin derivatives D-dimer ([facility]) >10,000.00 (H) 0.00 - 499.00 ng/mL (FEU)    Comment: (NOTE) <> Exclusion of Venous Thromboembolism (VTE) - OUTPATIENT ONLY   (Emergency Department or [place])   0-499 ng/ml (FEU): With a low to intermediate pretest probability                      for VTE this test result excludes the diagnosis                      of VTE.   >499 ng/ml (FEU) : VTE not excluded; additional work up for VTE is                      required. <> Testing on Inpatients and Evaluation of Disseminated Intravascular   Coagulation (DIC) Reference Range:   0-499 ng/ml (FEU) Performed at [facility], [address] [place], [place] [address]   Fibrinogen     Status: Abnormal   Collection Time: 08/28/19  5:44 AM  Result Value Ref Range   Fibrinogen 540 (H) 210 - 475 mg/dL    Comment: Performed at [facility], [address], [place], [place] [address]  Comprehensive metabolic panel     Status: Abnormal   Collection Time: 08/28/19  5:44 AM  Result Value Ref Range   Sodium 135 135 - 145  mmol/L   Potassium 3.6 3.5 - 5.1 mmol/L    Chloride 105 98 - 111 mmol/L   CO2 21 (L) 22 - 32 mmol/L   Glucose, Bld 124 (H) 70 - 99 mg/dL   BUN 7 6 - 20 mg/dL   Creatinine, Ser 5.99 0.44 - 1.00 mg/dL   Calcium 8.2 (L) 8.9 - 10.3 mg/dL   Total Protein 5.6 (L) 6.5 - 8.1 g/dL   Albumin 2.7 (L) 3.5 - 5.0 g/dL   AST 21 15 - 41 U/L   ALT 10 0 - 44 U/L   Alkaline Phosphatase 103 38 - 126 U/L   Total Bilirubin 0.5 0.3 - 1.2 mg/dL   GFR calc non Af Amer >60 >60 mL/min   GFR calc Af Amer >60 >60 mL/min   Anion gap 9 5 - 15    Comment: Performed at [facility], [address] [place], [place] [address]  Lactate dehydrogenase     Status: None   Collection Time: 08/28/19  5:44 AM  Result Value Ref Range   LDH 155 98 - 192 U/L    Comment: Performed at [facility], [address] [place], [place] [address]  Blood gas, arterial     Status: None (Preliminary result)   Collection Time: 08/28/19  5:53 AM  Result Value Ref Range   FIO2 0.21    pH, Arterial 7.39 7.350 - 7.450   pCO2 arterial 38 32.0 - 48.0 mmHg   pO2, Arterial 91 83.0 - 108.0 mmHg   Bicarbonate 23.0 20.0 - 28.0 mmol/L   Acid-base deficit 1.7 0.0 - 2.0 mmol/L   O2 Saturation 97.0 %   Patient temperature 37.0    Collection site RIGHT RADIAL    Sample type ARTERIAL DRAW    Allens test (pass/fail) PASS PASS    Comment: Performed at [facility], [address], [place], [place] [address]   Mechanical Rate PENDING      Assessment:   33 y.o. [id number] postoperative day #0 improving.   Plan:  1) Acute blood loss anemia - hemodynamically stable and asymptomatic  2) Blood Type --/--/A POS (10/11 1718) / Rubella 24.50 (04/07 1636) / Varicella Immune  3) TDAP status: received antepartum 07/28/2019  4) Breastfeeding  5) Disposition: Advance diet as tolerated, continue postoperative care.  Marcelyn Bruins, CNM 08/28/2019  2:36 PM

## 2019-08-28 NOTE — Transfer of Care (Signed)
Immediate Anesthesia Transfer of Care Note  Patient: Destiny Allen  Procedure(s) Performed: CESAREAN SECTION (N/A )  Patient Location: PACU OB  Anesthesia Type:Spinal  Level of Consciousness: awake, alert  and oriented  Airway & Oxygen Therapy: Patient Spontanous Breathing  Post-op Assessment: Report given to RN and Post -op Vital signs reviewed and stable  Post vital signs: Reviewed and stable  Last Vitals:  Vitals Value Taken Time  BP    Temp    Pulse    Resp    SpO2      Last Pain:  Vitals:   08/27/19 1850  TempSrc:   PainSc: 6          Complications: No apparent anesthesia complications

## 2019-08-28 NOTE — Anesthesia Preprocedure Evaluation (Signed)
Anesthesia Evaluation  Patient identified by MRN, date of birth, ID band Patient awake    Reviewed: Allergy & Precautions, H&P , NPO status , Patient's Chart, lab work & pertinent test results  History of Anesthesia Complications Negative for: history of anesthetic complications  Airway Mallampati: III  TM Distance: <3 FB Neck ROM: full    Dental  (+) Chipped   Pulmonary neg pulmonary ROS,           Cardiovascular Exercise Tolerance: Good (-) hypertensionnegative cardio ROS       Neuro/Psych    GI/Hepatic negative GI ROS,   Endo/Other    Renal/GU   negative genitourinary   Musculoskeletal   Abdominal   Peds  Hematology negative hematology ROS (+)   Anesthesia Other Findings Past Medical History: No date: Infertility associated with anovulation No date: Ovarian cyst  Past Surgical History: 2011: CESAREAN SECTION 11/12/2017: DILATION AND EVACUATION; N/A     Comment:  Procedure: DILATATION AND EVACUATION;  Surgeon:               Malachy Mood, MD;  Location: [facility] ORS;  Service:               Gynecology;  Laterality: N/A;  BMI    Body Mass Index: 34.13 kg/m      Reproductive/Obstetrics (+) Pregnancy                             Anesthesia Physical Anesthesia Plan  ASA: II  Anesthesia Plan: Spinal   Post-op Pain Management:    Induction:   PONV Risk Score and Plan:   Airway Management Planned: Natural Airway and Nasal Cannula  Additional Equipment:   Intra-op Plan:   Post-operative Plan:   Informed Consent: I have reviewed the patients History and Physical, chart, labs and discussed the procedure including the risks, benefits and alternatives for the proposed anesthesia with the patient or authorized representative who has indicated his/her understanding and acceptance.     Dental Advisory Given  Plan Discussed with: Anesthesiologist, CRNA and  Surgeon  Anesthesia Plan Comments: (Patient reports no bleeding problems and no anticoagulant use.  Plan for spinal with backup GA  Patient consented for risks of anesthesia including but not limited to:  - adverse reactions to medications - risk of bleeding, infection, nerve damage and headache - risk of failed spinal - damage to teeth, lips or other oral mucosa - sore throat or hoarseness - Damage to heart, brain, lungs or loss of life  Patient voiced understanding.)        Anesthesia Quick Evaluation

## 2019-08-28 NOTE — Progress Notes (Signed)
Called to see patient for rapid breathing, all of a sudden, as she was in recovery room following CS >1 hour ago.  No pain reported.  Fatigue and pale all of a sudden.  BP 126/68   Pulse 96   Temp 98.7 F (37.1 C) (Oral)   Resp (!) 40   Ht 4\' 11"  (1.499 m)   Wt 76.7 kg   LMP 12/05/2018 (Exact Date)   SpO2 97%   Breastfeeding Unknown   BMI 34.13 kg/m  Somnulent but arousable Chest clear B Heart RRR Inc C/D/I Lochia min Extr- no calf T Neuro: no focal deficits  A: Tachypnea S/p Cesarean Section  P: Will assess for PE, consider other etiologies CXR, Labs, EKG, ABG Consider CT chest  Barnett Applebaum, MD, [facility], [facility] 08/28/2019  5:26 AM

## 2019-08-28 NOTE — Anesthesia Procedure Notes (Signed)
Spinal  Patient location during procedure: OR Start time: 08/28/2019 2:55 AM End time: 08/28/2019 3:00 AM Staffing Resident/CRNA: Clinton Sawyer, CRNA Performed: resident/CRNA  Preanesthetic Checklist Completed: patient identified, site marked, surgical consent, pre-op evaluation, timeout performed, IV checked, risks and benefits discussed and monitors and equipment checked Spinal Block Patient position: sitting Prep: ChloraPrep Patient monitoring: heart rate, continuous pulse ox, blood pressure and cardiac monitor Approach: midline Location: L3-4 Injection technique: single-shot Needle Needle type: Introducer and Pencil-Tip  Needle gauge: 24 G Needle length: 9 cm Assessment Sensory level: T4 Additional Notes Negative paresthesia. Negative blood return. Positive free-flowing clear CSF. Expiration date of kit checked and confirmed. Patient tolerated procedure well, without complications.

## 2019-08-28 NOTE — Op Note (Signed)
Cesarean Section Procedure Note Indications: Preterm Labor at 36 weeks, and prior cesarean section  Pre-operative Diagnosis: Intrauterine pregnancy [redacted]w[redacted]d ;  Preterm Labor at 36 weeks and prior cesarean section Post-operative Diagnosis: same, delivered. Procedure: Low Transverse Cesarean Section Surgeon: Barnett Applebaum, MD, FACOG Assistant(s): Leticia Penna, surg tech Anesthesia: Spinal anesthesia Estimated Blood WFUX:323 mL Complications: None; patient tolerated the procedure well. Disposition: PACU - hemodynamically stable. Condition: stable  Findings: A female infant in the cephalic presentation. "Liam" Amniotic fluid - Clear  Birth weight 6-15 lbs.  Apgars of 8 and 9.  Intact placenta with a three-vessel cord. Grossly normal uterus, tubes and ovaries bilaterally. Some lower uterine segment intraabdominal adhesions were noted.  Procedure Details   The patient was taken to Operating Room, identified as the correct patient and the procedure verified as C-Section Delivery. A Time Out was held and the above information confirmed. After induction of anesthesia, the patient was draped and prepped in the usual sterile manner. A Pfannenstiel incision was made and carried down through the subcutaneous tissue to the fascia. Fascial incision was made and extended transversely with the Mayo scissors. The fascia was separated from the underlying rectus tissue superiorly and inferiorly. The peritoneum was identified and entered bluntly. Peritoneal incision was extended longitudinally. The utero-vesical peritoneal reflection was incised transversely and a bladder flap was created digitally.  A low transverse hysterotomy was made. The fetus was delivered atraumatically. The umbilical cord was clamped x2 and cut and the infant was handed to the awaiting pediatricians. The placenta was removed intact and appeared normal with a 3-vessel cord.  The uterus was exteriorized and cleared of all clot and debris. The  hysterotomy was closed with running sutures of 0 Vicryl suture. A second imbricating layer was placed with the same suture. Excellent hemostasis was observed. The uterus was returned to the abdomen. The pelvis was irrigated and again, excellent hemostasis was noted.  The On Q Pain pump System was then placed.  Trocars were placed through the abdominal wall into the subfascial space and these were used to thread the silver soaker cathaters into place.The rectus fascia was then reapproximated with running sutures of Maxon, with careful placement not to incorporate the cathaters. Subcutaneous tissues are then irrigated with saline and hemostasis assured.  Skin is then closed with 4-0 vicryl suture in a subcuticular fashion followed by skin adhesive. The cathaters are flushed each with 5 mL of Bupivicaine and stabilized into place with dressing. Instrument, sponge, and needle counts were correct prior to the abdominal closure and at the conclusion of the case.  The patient tolerated the procedure well and was transferred to the recovery room in stable condition.   Barnett Applebaum, MD, [facility], [facility] 08/28/2019  3:59 AM

## 2019-08-28 NOTE — Lactation Note (Signed)
This note was copied from a baby's chart. Lactation Consultation Note  Patient Name: Destiny Allen Today's Date: 08/28/2019   Mom wanted to breastfed, asking for lactation assistance.  When entered room FOB had just given bottle of formula stating mom did not have any milk.  Demonstrated hand expression of colostrum.  Destiny Allen was rooting and sucking on hands.  Assisted mom in comfortable position in biological hold with pillow support skin to skin.  Destiny Allen latched with minimal assistance and began strong rhythmic sucking and occasional swallows.  Demonstrated how to massage breast and gently stimulate Destiny Allen to keep him actively sucking at the breast for 15 minutes.  When he came off the right breast, he kept rooting, so mom was willing to put him on other breast where he sucked for another 15 minutes.  Pointed out feeding cues to mom and encouraged to put Destiny Allen to the breast whenever he demonstrated hunger cues to bring in mature milk and ensure a plentiful milk supply.  Discussed with parents newborn stomach, supply and demand and risks of continuing to give large volumes of formula to successful breast feeding.  Reviewed normal course of lactation and routine newborn feeding patterns.  Lactation name and number written on white board and encouraged to call with any questions, concerns or assistance.      Maternal Data    Feeding Feeding Type: Breast Fed  LATCH Score                   Interventions    Lactation Tools Discussed/Used     Consult Status      Destiny Allen 08/28/2019, 10:30 PM

## 2019-08-28 NOTE — Telephone Encounter (Signed)
-----   Message from Gae Dry, MD sent at 08/28/2019  4:04 AM EDT ----- Regarding: appt 1. Cancel prenatal (ROB) appt's 2. Sch Post Op (from CS) appt next Monday or after w [facility]

## 2019-08-29 LAB — CBC
HCT: 31.3 % — ABNORMAL LOW (ref 36.0–46.0)
Hemoglobin: 10.2 g/dL — ABNORMAL LOW (ref 12.0–15.0)
MCH: 28.7 pg (ref 26.0–34.0)
MCHC: 32.6 g/dL (ref 30.0–36.0)
MCV: 87.9 fL (ref 80.0–100.0)
Platelets: 225 10*3/uL (ref 150–400)
RBC: 3.56 MIL/uL — ABNORMAL LOW (ref 3.87–5.11)
RDW: 14.1 % (ref 11.5–15.5)
WBC: 8.9 10*3/uL (ref 4.0–10.5)
nRBC: 0 % (ref 0.0–0.2)

## 2019-08-29 NOTE — Progress Notes (Signed)
Postpartum Progress Note  Called to see patient after another syncopal episode. Patient reports she started to feel lightheaded when she was standing up talking with the lactation consultant. She became sweaty and pale. She was assisted back to bed and she sat down, but she continued to feel lightheaded. She was then assisted in lying down. She did faint and there was a loss of consciousness for about 1 minute. She denies any chest pain, headache, SOB associated with this episode.   EXam: General: alert awake and in NAD Lying down with baby on chest  BP: 124/82 Pulse 87  Respiratory: normal respiratory effort Stat CBC: Results for orders placed or performed during the hospital encounter of 08/27/19 (from the past 24 hour(s))  CBC     Status: Abnormal   Collection Time: 08/29/19 12:36 PM  Result Value Ref Range   WBC 8.9 4.0 - 10.5 K/uL   RBC 3.56 (L) 3.87 - 5.11 MIL/uL   Hemoglobin 10.2 (L) 12.0 - 15.0 g/dL   HCT 31.3 (L) 36.0 - 46.0 %   MCV 87.9 80.0 - 100.0 fL   MCH 28.7 26.0 - 34.0 pg   MCHC 32.6 30.0 - 36.0 g/dL   RDW 14.1 11.5 - 15.5 %   Platelets 225 150 - 400 K/uL   nRBC 0.0 0.0 - 0.2 %   A: s/p probable vasovagal episode  P: Discussed case with Dr Bridgett Larsson, hospitalist. He did not think further imaging was needed I.e. CT of head Will consult with Dr Gilman Schmidt. Assist OOB  Dalia Heading, CNM

## 2019-08-29 NOTE — Anesthesia Postprocedure Evaluation (Cosign Needed)
Anesthesia Post Note  Patient: Destiny Allen  Procedure(s) Performed: CESAREAN SECTION (N/A )  Patient location during evaluation: Nursing Unit Anesthesia Type: Spinal Level of consciousness: awake, awake and alert and oriented Pain management: pain level controlled Vital Signs Assessment: post-procedure vital signs reviewed and stable Respiratory status: spontaneous breathing, nonlabored ventilation and respiratory function stable Cardiovascular status: blood pressure returned to baseline and stable Postop Assessment: no headache and no backache Anesthetic complications: no     Last Vitals:  Vitals:   08/29/19 0100 08/29/19 0300  BP:  106/68  Pulse:  86  Resp:  18  Temp:  36.8 C  SpO2: 96% 97%    Last Pain:  Vitals:   08/29/19 0300  TempSrc: Axillary  PainSc: 0-No pain                 [employer]

## 2019-08-29 NOTE — Lactation Note (Signed)
This note was copied from a baby's chart. Lactation Consultation Note  Patient Name: Destiny Allen [id number] Date: 08/29/2019 Reason for consult: Follow-up assessment;Mother's request  RN called out for assistance with breastfeeding. LC introduced to mom and baby Destiny Allen. Destiny Allen was in cradle position with mom sitting up in room chair. Baby was sleeping, and appeared content and relaxed. Mom reported baby was crying and upset earlier, appearing to be hungry, but then settled down when put close to her and breast. Baby Destiny Allen has received multiple formula bottles throughout the night and early this morning, most recent of 85ml at 7:30am.  Reviewed with mom early hunger cues, signs of fullness/content, and impact formula may have on producing a plentiful supply for Destiny Allen. Mom requests a hand pump for stimulation/milk-removal, and for ongoing use post-discharge. Discussed with mom importance of putting baby to breast for milk removal before providing additional supplement via bottle. Mom understands and is aware, and plans to call out for breastfeeding help at next sign of hunger. Encouraged skin to skin throughout the day as possible, helping with breastfeeding relationship. LC name/number written on whiteboard; mom plans to call for help.  Maternal Data Formula Feeding for Exclusion: No Has patient been taught Hand Expression?: Yes  Feeding Feeding Type: Breast Fed Nipple Type: Slow - flow  LATCH Score Latch: Too sleepy or reluctant, no latch achieved, no sucking elicited.                 Interventions Interventions: Breast feeding basics reviewed;Support pillows;Position options;Hand pump  Lactation Tools Discussed/Used     Consult Status Consult Status: Follow-up Date: 08/29/19 Follow-up type: In-patient    Lavonia Drafts 08/29/2019, 10:56 AM

## 2019-08-29 NOTE — Anesthesia Post-op Follow-up Note (Signed)
  Anesthesia Pain Follow-up Note  Patient: Destiny Allen  Day #: 2  Date of Follow-up: 08/29/2019 Time: 8:05 AM  Last Vitals:  Vitals:   08/29/19 0100 08/29/19 0300  BP:  106/68  Pulse:  86  Resp:  18  Temp:  36.8 C  SpO2: 96% 97%    Level of Consciousness: alert  Pain: none   Side Effects:None  Catheter Site Exam:clean     Plan: D/C from anesthesia care at surgeon's request  [facility]

## 2019-08-29 NOTE — Progress Notes (Signed)
POD1 repeat CS. S/p syncopal episode postoperatively Subjective:  Feeling better today. No further headache. That resolved over night. OOB to BR without lightheadedness. Tolerating regular diet. No flatus yet. Baby breast feeding well.    Objective:  Blood pressure 134/83, pulse 94, temperature 98 F (36.7 C), temperature source Oral, resp. rate 18, height 4\' 11"  (1.499 m), weight 76.7 kg, last menstrual period 12/05/2018, SpO2 97 %, unknown if currently breastfeeding.  Urine output: 1850 ml General: Hispanic female in NAD Heart: RRR without murmur Pulmonary: no increased work of breathing/ CTAB Abdomen: non-distended, non-tender, fundus firm at level of umbilicus-1FB, BS active Incision: Honeycomb dressing C&D&I, ON Q intact Extremities: no edema, no erythema, no tenderness Neuro: alert, awake, answering questions appropriately  Results for orders placed or performed during the hospital encounter of 08/27/19 (from the past 72 hour(s))  Urinalysis, Complete w Microscopic     Status: Abnormal   Collection Time: 08/27/19  5:17 PM  Result Value Ref Range   Color, Urine YELLOW (A) YELLOW   APPearance HAZY (A) CLEAR   Specific Gravity, Urine 1.011 1.005 - 1.030   pH 7.0 5.0 - 8.0   Glucose, UA NEGATIVE NEGATIVE mg/dL   Hgb urine dipstick NEGATIVE NEGATIVE   Bilirubin Urine NEGATIVE NEGATIVE   Ketones, ur NEGATIVE NEGATIVE mg/dL   Protein, ur NEGATIVE NEGATIVE mg/dL   Nitrite NEGATIVE NEGATIVE   Leukocytes,Ua NEGATIVE NEGATIVE   RBC / HPF 0-5 0 - 5 RBC/hpf   WBC, UA 0-5 0 - 5 WBC/hpf   Bacteria, UA NONE SEEN NONE SEEN   Squamous Epithelial / LPF 0-5 0 - 5   Mucus PRESENT     Comment: Performed at [facility], [address] [place], [place] [address]  Sample to Blood Bank     Status: None   Collection Time: 08/27/19  5:18 PM  Result Value Ref Range   Blood Bank Specimen SAMPLE AVAILABLE FOR TESTING    Sample Expiration      08/30/2019,2359 Performed at [facility], [address] [place], [place] [address]   Type and screen [facility]     Status: None   Collection Time: 08/27/19  5:18 PM  Result Value Ref Range   ABO/RH(D) A POS    Antibody Screen NEG    Sample Expiration      08/30/2019,2359 Performed at [facility], [address] [place], [place] [address]   CBC with Differential/Platelet     Status: Abnormal   Collection Time: 08/27/19  6:10 PM  Result Value Ref Range   WBC 8.4 4.0 - 10.5 K/uL   RBC 3.86 (L) 3.87 - 5.11 MIL/uL   Hemoglobin 11.1 (L) 12.0 - 15.0 g/dL   HCT 91.434.1 (L) 78.236.0 - 95.646.0 %   MCV 88.3 80.0 - 100.0 fL   MCH 28.8 26.0 - 34.0 pg   MCHC 32.6 30.0 - 36.0 g/dL   RDW 21.313.9 08.611.5 - 57.815.5 %   Platelets 225 150 - 400 K/uL   nRBC 0.0 0.0 - 0.2 %   Neutrophils Relative % 67 %   Neutro Abs 5.7 1.7 - 7.7 K/uL   Lymphocytes Relative 24 %   Lymphs Abs 2.0 0.7 - 4.0 K/uL   Monocytes Relative 7 %   Monocytes Absolute 0.6 0.1 - 1.0 K/uL   Eosinophils Relative 1 %   Eosinophils Absolute 0.1 0.0 - 0.5 K/uL   Basophils Relative 0 %   Basophils Absolute 0.0 0.0 - 0.1  K/uL   Immature Granulocytes 1 %   Abs Immature Granulocytes 0.06 0.00 - 0.07 K/uL    Comment: Performed at [facility], [address] [place], [place] [address]  SARS CORONAVIRUS 2 (TAT 6-24 HRS) Nasopharyngeal Nasopharyngeal Swab     Status: None   Collection Time: 08/28/19 12:01 AM   Specimen: Nasopharyngeal Swab  Result Value Ref Range   SARS Coronavirus 2 NEGATIVE NEGATIVE    Comment: (NOTE) SARS-CoV-2 target nucleic acids are NOT DETECTED. The SARS-CoV-2 RNA is generally detectable in upper and lower respiratory specimens during the acute phase of infection. Negative results do not preclude SARS-CoV-2 infection, do not rule out co-infections with other pathogens, and should not be used as the sole basis for treatment or other patient management decisions. Negative results must be combined with  clinical observations, patient history, and epidemiological information. The expected result is Negative. Fact Sheet for Patients: HairSlick.no Fact Sheet for Healthcare Providers: quierodirigir.com This test is not yet approved or cleared by the [place] FDA and  has been authorized for detection and/or diagnosis of SARS-CoV-2 by FDA under an Emergency Use Authorization (EUA). This EUA will remain  in effect (meaning this test can be used) for the duration of the COVID-19 declaration under Section 56 4(b)(1) of the Act, 21 U.S.C. section 360bbb-3(b)(1), unless the authorization is terminated or revoked sooner. Performed at [facility], [address], [place], [place] [address]   SARS Coronavirus 2 by RT PCR (hospital order, performed in [facility] hospital lab) Nasopharyngeal Nasopharyngeal Swab     Status: None   Collection Time: 08/28/19  1:11 AM   Specimen: Nasopharyngeal Swab  Result Value Ref Range   SARS Coronavirus 2 NEGATIVE NEGATIVE    Comment: (NOTE) If result is NEGATIVE SARS-CoV-2 target nucleic acids are NOT DETECTED. The SARS-CoV-2 RNA is generally detectable in upper and lower  respiratory specimens during the acute phase of infection. The lowest  concentration of SARS-CoV-2 viral copies this assay can detect is 250  copies / mL. A negative result does not preclude SARS-CoV-2 infection  and should not be used as the sole basis for treatment or other  patient management decisions.  A negative result may occur with  improper specimen collection / handling, submission of specimen other  than nasopharyngeal swab, presence of viral mutation(s) within the  areas targeted by this assay, and inadequate number of viral copies  (<250 copies / mL). A negative result must be combined with clinical  observations, patient history, and epidemiological information. If result is POSITIVE SARS-CoV-2 target nucleic  acids are DETECTED. The SARS-CoV-2 RNA is generally detectable in upper and lower  respiratory specimens dur ing the acute phase of infection.  Positive  results are indicative of active infection with SARS-CoV-2.  Clinical  correlation with patient history and other diagnostic information is  necessary to determine patient infection status.  Positive results do  not rule out bacterial infection or co-infection with other viruses. If result is PRESUMPTIVE POSTIVE SARS-CoV-2 nucleic acids MAY BE PRESENT.   A presumptive positive result was obtained on the submitted specimen  and confirmed on repeat testing.  While 2019 novel coronavirus  (SARS-CoV-2) nucleic acids may be present in the submitted sample  additional confirmatory testing may be necessary for epidemiological  and / or clinical management purposes  to differentiate between  SARS-CoV-2 and other Sarbecovirus currently known to infect humans.  If clinically indicated additional testing with an alternate test  methodology [phone])  is advised. The SARS-CoV-2 RNA is generally  detectable in upper and lower respiratory sp ecimens during the acute  phase of infection. The expected result is Negative. Fact Sheet for Patients:  BoilerBrush.com.cy Fact Sheet for Healthcare Providers: https://pope.com/ This test is not yet approved or cleared by the [place] FDA and has been authorized for detection and/or diagnosis of SARS-CoV-2 by FDA under an Emergency Use Authorization (EUA).  This EUA will remain in effect (meaning this test can be used) for the duration of the COVID-19 declaration under Section 564(b)(1) of the Act, 21 U.S.C. section 360bbb-3(b)(1), unless the authorization is terminated or revoked sooner. Performed at [facility], [address] [place], [place] [address]   Glucose, capillary     Status: Abnormal   Collection Time: 08/28/19  5:13 AM  Result  Value Ref Range   Glucose-Capillary 126 (H) 70 - 99 mg/dL  Protime-INR     Status: None   Collection Time: 08/28/19  5:44 AM  Result Value Ref Range   Prothrombin Time 14.0 11.4 - 15.2 seconds   INR 1.1 0.8 - 1.2    Comment: (NOTE) INR goal varies based on device and disease states. Performed at [facility], [address] [place], [place] [address]   APTT     Status: None   Collection Time: 08/28/19  5:44 AM  Result Value Ref Range   aPTT 28 24 - 36 seconds    Comment: Performed at [facility], [address] [place], [place] [address]  CBC     Status: Abnormal   Collection Time: 08/28/19  5:44 AM  Result Value Ref Range   WBC 13.5 (H) 4.0 - 10.5 K/uL   RBC 3.92 3.87 - 5.11 MIL/uL   Hemoglobin 11.2 (L) 12.0 - 15.0 g/dL   HCT 19.4 (L) 17.4 - 08.1 %   MCV 87.8 80.0 - 100.0 fL   MCH 28.6 26.0 - 34.0 pg   MCHC 32.6 30.0 - 36.0 g/dL   RDW 44.8 18.5 - 63.1 %   Platelets 198 150 - 400 K/uL   nRBC 0.0 0.0 - 0.2 %    Comment: Performed at [facility], [address], [place], [place] [address]  Fibrin derivatives [facility] only)     Status: Abnormal   Collection Time: 08/28/19  5:44 AM  Result Value Ref Range   Fibrin derivatives D-dimer ([facility]) >10,000.00 (H) 0.00 - 499.00 ng/mL (FEU)    Comment: (NOTE) <> Exclusion of Venous Thromboembolism (VTE) - OUTPATIENT ONLY   (Emergency Department or [place])   0-499 ng/ml (FEU): With a low to intermediate pretest probability                      for VTE this test result excludes the diagnosis                      of VTE.   >499 ng/ml (FEU) : VTE not excluded; additional work up for VTE is                      required. <> Testing on Inpatients and Evaluation of Disseminated Intravascular   Coagulation (DIC) Reference Range:   0-499 ng/ml (FEU) Performed at [facility], [address] [place], [place] [address]   Fibrinogen     Status: Abnormal   Collection Time: 08/28/19  5:44 AM   Result Value Ref Range   Fibrinogen  540 (H) 210 - 475 mg/dL    Comment: Performed at [facility], [address] [place], [place] [address]  Comprehensive metabolic panel     Status: Abnormal   Collection Time: 08/28/19  5:44 AM  Result Value Ref Range   Sodium 135 135 - 145 mmol/L   Potassium 3.6 3.5 - 5.1 mmol/L   Chloride 105 98 - 111 mmol/L   CO2 21 (L) 22 - 32 mmol/L   Glucose, Bld 124 (H) 70 - 99 mg/dL   BUN 7 6 - 20 mg/dL   Creatinine, Ser 0.48 0.44 - 1.00 mg/dL   Calcium 8.2 (L) 8.9 - 10.3 mg/dL   Total Protein 5.6 (L) 6.5 - 8.1 g/dL   Albumin 2.7 (L) 3.5 - 5.0 g/dL   AST 21 15 - 41 U/L   ALT 10 0 - 44 U/L   Alkaline Phosphatase 103 38 - 126 U/L   Total Bilirubin 0.5 0.3 - 1.2 mg/dL   GFR calc non Af Amer >60 >60 mL/min   GFR calc Af Amer >60 >60 mL/min   Anion gap 9 5 - 15    Comment: Performed at [facility], [address] [place], [place] [address]  Lactate dehydrogenase     Status: None   Collection Time: 08/28/19  5:44 AM  Result Value Ref Range   LDH 155 98 - 192 U/L    Comment: Performed at [facility], [address] [place], [place] [address]  Blood gas, arterial     Status: None (Preliminary result)   Collection Time: 08/28/19  5:53 AM  Result Value Ref Range   FIO2 0.21    pH, Arterial 7.39 7.350 - 7.450   pCO2 arterial 38 32.0 - 48.0 mmHg   pO2, Arterial 91 83.0 - 108.0 mmHg   Bicarbonate 23.0 20.0 - 28.0 mmol/L   Acid-base deficit 1.7 0.0 - 2.0 mmol/L   O2 Saturation 97.0 %   Patient temperature 37.0    Collection site RIGHT RADIAL    Sample type ARTERIAL DRAW    Allens test (pass/fail) PASS PASS    Comment: Performed at [facility], [address] [place], [place] [address]   Mechanical Rate PENDING      Assessment:   33 y.o. [id number] postoperativeday # 1-stable  ambulate S/p syncopal episode postoperatively. No further problems   Plan:   1) A POS/RI/VI  2) TDAP -given A  3)  Breast/Bottle/Contraception-?NFP  4) Disposition-possible discharge POD #2 or #3  Dalia Heading, CNM

## 2019-08-29 NOTE — Telephone Encounter (Signed)
Called and left generic message for patient to call back to confirm schedule appointent

## 2019-08-29 NOTE — Lactation Note (Signed)
This note was copied from a baby's chart. Lactation Consultation Note  Patient Name: Boy Chasitee Zenker TIWPY'K Date: 08/29/2019 Reason for consult: Follow-up assessment;Mother's request  LC and Pierre intern returned to set-up and educate mom on use of DEBP per her earlier request. Mom reported just finishing a feed with Liam via breast, mom reported baby latching well with some nipple soreness, but felt it was a success. [employer] intern explained how to clean the parts/pieces, how to put the pump together, and milk storage and warming guidelines LC intern began to explain and demonstrate pumping with size 33mm breast flange when mom began to feel lightheaded and stating she felt she was going to pass out. Mom moved herself to the bottom of the bed to top of the bed by walking where she began to become clammy and unsteady on her feet. LC and Aid in the room assisted with mom getting in the bed where she did pass-out briefly, but came to quickly and was able to coherently answer questions and converse with staff in the room. Mom encouraged to rest, LC will follow-up at a later time.  Maternal Data Formula Feeding for Exclusion: No Has patient been taught Hand Expression?: Yes  Feeding Feeding Type: Breast Fed  LATCH Score Latch: Too sleepy or reluctant, no latch achieved, no sucking elicited.                 Interventions Interventions: DEBP;Breast feeding basics reviewed  Lactation Tools Discussed/Used Pump Review: Milk Storage;Setup, frequency, and cleaning Initiated by:: Gerome Sam, MPH, IBCLC Date initiated:: 08/29/19   Consult Status Consult Status: Follow-up Date: 08/29/19 Follow-up type: In-patient    Lavonia Drafts 08/29/2019, 11:34 AM

## 2019-08-30 ENCOUNTER — Encounter: Payer: 59 | Admitting: Obstetrics and Gynecology

## 2019-08-30 LAB — CBC
HCT: 30.1 % — ABNORMAL LOW (ref 36.0–46.0)
Hemoglobin: 9.6 g/dL — ABNORMAL LOW (ref 12.0–15.0)
MCH: 28.3 pg (ref 26.0–34.0)
MCHC: 31.9 g/dL (ref 30.0–36.0)
MCV: 88.8 fL (ref 80.0–100.0)
Platelets: 207 10*3/uL (ref 150–400)
RBC: 3.39 MIL/uL — ABNORMAL LOW (ref 3.87–5.11)
RDW: 14.2 % (ref 11.5–15.5)
WBC: 7.3 10*3/uL (ref 4.0–10.5)
nRBC: 0 % (ref 0.0–0.2)

## 2019-08-30 MED ORDER — OXYCODONE HCL 5 MG PO TABS: 5.0000 mg | ORAL_TABLET | Freq: Four times a day (QID) | ORAL | 0 refills | Status: AC | PRN

## 2019-08-30 NOTE — Lactation Note (Signed)
This note was copied from a baby's chart. Lactation Consultation Note  Patient Name: Destiny Allen ZOXWR'[id number] Date: 08/30/2019 Reason for consult: Follow-up assessment  LC followed up this morning with mom and baby Destiny Allen.  Mom reports Destiny Allen is getting better with breastfeeding, and she is working on making sure his lips are flanged on top and bottom at every feed.  Formula was given overnight by dad via bottle, he wanted to let mom rest as much as possible before going home. Discussed plans for feeding once home, both mom and dad state they plan to primarily breastfeed, and do less formula. Praised them both for dedication and support of breastfeeding, and choices they are making for Destiny Allen's health. LC provided breastfeeding basic education including newborn stomach size, growth patterns, cluster feeding, early hunger cues, and signs of fullness. Encouraged continued tracking of wet/stool diapers. Mom is starting to experience breast fullness, some warmth and tenderness. Provided education and guidance on management of fullness, breast massage and compression while Destiny Allen is feeding, and use of her hand pump for breast comfort if needed.  Provided information on lactation outpatient consult services, and virtual breastfeeding support groups available through [facility] once discharged.  Maternal Data Formula Feeding for Exclusion: No Has patient been taught Hand Expression?: Yes  Feeding    LATCH Score                   Interventions Interventions: Hand pump;Breast feeding basics reviewed  Lactation Tools Discussed/Used     Consult Status Consult Status: Complete Date: 08/30/19 Follow-up type: Call as needed    Lavonia Drafts 08/30/2019, 11:24 AM

## 2019-08-30 NOTE — Discharge Instructions (Signed)
Parto por Destiny Allen, cuidados posteriores Cesarean Delivery, Care After Destiny Allen brinda informacin sobre cmo cuidarse despus del procedimiento. El mdico tambin podr darle indicaciones ms especficas. Comunquese con el mdico si tiene problemas o preguntas. Qu puedo esperar despus del procedimiento? Despus del procedimiento, es comn DIRECTV siguientes sntomas: Una pequea cantidad de sangre o de lquido transparente que proviene de la incisin. Enrojecimiento, hinchazn y dolor en la zona de la incisin. Dolor y [place] abdominales. Hemorragia vaginal (loquios). Aunque no haya tenido parto vaginal, tendr sangrado y secrecin vaginal. Calambres plvicos. Fatiga. Es posible que Destiny Allen, hinchazn y Associate Professor en el tejido que se encuentra entre la vagina y el ano (perineo) en los siguientes casos: Si la cesrea no fue planificada y Consulting civil engineer el Destiny Allen de parto y pujar. Si le hicieron una incisin en la zona (episiotoma) o el tejido se desgarr durante el intento de parto vaginal. Siga estas indicaciones en su casa: Cuidados de la incisin  Siga las indicaciones del mdico acerca del cuidado de la incisin. Asegrese de hacer lo siguiente: Lvese las manos con agua y jabn antes de Multimedia programmer las vendas (vendaje). Use desinfectante para manos si no dispone de [place] y [place]. Si tiene un vendaje, cmbielo o quteselo siguiendo las indicaciones del mdico. No retire los puntos (suturas), las grapas cutneas, la goma para cerrar la piel o las tiras Baker. Es posible que estos cierres cutneos Conservation Allen, nature en la piel durante 2semanas o ms tiempo. Si los bordes de las tiras [address] empiezan a despegarse y Scientific laboratory technician, puede recortar los que estn sueltos. No retire las tiras [employer] por completo a menos que el mdico se lo indique. Controle todos los das la zona de la incisin para detectar signos de infeccin. Est atento a los siguientes signos: Aumento  del enrojecimiento, la hinchazn o Destiny Allen. Ms lquido Destiny Allen. Calor. Pus o mal olor. No tome baos de inmersin, no nade ni use un jacuzzi hasta que el mdico la autorice. Pregntele al mdico si puede ducharse. Cuando tosa o estornude, abrace [employer]. Esto ayuda con el dolor y Nordstrom posibilidad de que su incisin se abra (dehiscencia). Haga esto hasta que la incisin cicatrice. Medicamentos [employer] de venta libre y los recetados solamente como se lo haya indicado el mdico. Si le recetaron un antibitico, tmelo como se lo haya indicado el mdico. No deje de tomar el antibitico aunque comience a sentirse mejor. No conduzca ni use maquinaria pesada mientras toma analgsicos recetados. Estilo de vida No beba alcohol. Esto es de suma importancia si est amamantando o toma analgsicos. No consuma ningn producto que contenga nicotina o tabaco, como cigarrillos, cigarrillos electrnicos y tabaco de Theatre Allen. Si necesita ayuda para dejar de fumar, consulte al Destiny Allen. Comida y bebida Beba al menos 8vasos de ochoonzas (240cc) de agua todos los [address]  [address] a menos que el mdico le indique lo contrario. Si amamanta, quiz deba beber an ms cantidad de agua. Coma alimentos ricos en [employer]. Estos alimentos pueden ayudar a prevenir o Educational psychologist. Los alimentos ricos en fibra incluyen los siguientes: Panes y cereales integrales. Arroz integral. Hilda Lias. Nils Pyle y verduras frescas. Actividad  Si es posible, pdale a alguien que la ayude a Scientist, product/process development del beb y con las tareas del hogar durante al menos [employer] despus de que le den el alta del hospital. Retome sus actividades normales como se lo haya indicado el mdico. Pregntele al mdico qu actividades son seguras para  usted. Descanse todo lo que pueda. Trate de descansar o tomar una siesta mientras el beb duerme. No levante ningn objeto que pese ms de 10libras (4,5kg) o el lmite de peso que  le hayan indicado, hasta que el mdico le diga que puede [place]. Hable con el mdico sobre cundo puede retomar la actividad sexual. Esto puede depender de lo siguiente: Riesgo de sufrir una infeccin. La rapidez con que cicatrice. Comodidad y deseo de Destiny Standard Companies sexual. Indicaciones generales No use tampones ni se haga duchas vaginales hasta que el mdico la autorice. Use ropa floja y cmoda y un sostn firme y Nurse, adult. Mantenga el perineo limpio y seco. Cuando vaya al bao, siempre higiencese de adelante hacia atrs. Si expulsa un cogulo de sangre, gurdelo y llame al mdico para informrselo. No deseche los cogulos de sangre por el inodoro antes de recibir indicaciones del mdico. Consulting civil engineer a todas las visitas de seguimiento para usted y Photographer beb, como se lo haya indicado el mdico. Esto es importante. Comunquese con un mdico si: Tiene los siguientes sntomas: Systems analyst. Secrecin vaginal con mal olor. Pus o mal olor en Destiny Allen de la incisin. Dificultad o dolor al [employer]. Aumento o disminucin repentinos de la frecuencia de las deposiciones. Aumento del enrojecimiento, la hinchazn o el dolor alrededor de la incisin. Aumento del lquido o sangre proveniente de la incisin. Erupcin cutnea. Nuseas. Poco inters o falta de inters en actividades que solan gustarle. Dudas sobre su cuidado y el del beb. Su incisin se siente caliente al tacto. Siente dolor en las mamas y se ponen rojas o duras. Siente tristeza o preocupacin de forma inusual. Vomita. Elimina un cogulo de sangre grande por la vagina. Orina ms de lo habitual. Se siente mareado o aturdido. Solicite ayuda inmediatamente si: Tiene los siguientes sntomas: Dolor que no desaparece o no mejora con medicamentos. Dolor en el pecho. Dificultad para respirar. Visin borrosa o manchas en la visin. Pensamientos de autolesionarse o lesionar al beb. Nuevo Destiny Allen abdomen o en una de las  piernas. Dolor de cabeza intenso. Se desmaya. Tiene una hemorragia tan intensa en la vagina que empapa ms de un apsito en Destiny Allen. El sangrado no debe ser ms abundante que el perodo ms intenso que haya tenido. Resumen Despus del procedimiento, es comn Theatre Destiny Allen de la incisin, clicos abdominales, y sangrado vaginal leve. Como zona de la incisin para detectar signos de infeccin. Informe al mdico sobre cualquier sntoma inusual. Concurra a todas las visitas de seguimiento para usted y el beb, como se lo haya indicado el mdico. Esta informacin no tiene Marine scientist el consejo del mdico. Asegrese de hacerle al mdico cualquier pregunta que tenga. Document Released: 11/02/2005 Document Revised: 06/15/2018 Document Reviewed: 06/15/2018 Elsevier Patient Education  [place] y el cuidado personal Breastfeeding and Self-Care Al comenzar a Economist al nuevo beb, es normal que surjan algunos problemas. Sin embargo, hay ciertas cosas que puede hacer para cuidarse y ayudar a prevenir muchos problemas frecuentes. Por ejemplo, debe mantener sus mamas sanas y asegurarse de que el beb agarre bien el pezn con su boca (se prenda) cuando lo amamante. Consulte a su mdico o a Public relations account executive (asesor en Transport Allen) para Neurosurgeon qu es lo que mejor funciona en su caso. Siga estas indicaciones en su casa: Estrategia para la lactancia  Asegrese siempre de que el beb se prenda bien para Economist. Asegrese  de que el beb est en una posicin adecuada. Pruebe diferentes posiciones para Copyamamantar hasta encontrar una que funcione tanto para usted como para el beb. Amamante al beb cuando muestre signos de hambre o si siente la necesidad de Destiny Allen sus mamas. Esto se denomina "lactancia a demanda". No retrase los horarios para Museum/gallery exhibitions officeramamantar. Intente relajarse cuando sea la hora de alimentar al beb. De este Hastingsmodo,  ayuda a que el cuerpo libere la [place]che de sus El Brazilmamas. Para ayudar a que aumente el flujo de Velda Cityleche: Squese una pequea cantidad de Greensboroleche de las mamas justo antes de Museum/gallery exhibitions officeramamantar. Para hacerlo, use un sacaleches o use las manos. Aplquese calor hmedo en las mamas justo antes de Southmaydamamantar. Puede hacerlo en la ducha o con toallas de mano humedecidas con agua tibia. Hgase masajes en las mamas justo antes de amamantar o Harrisonmientras amamanta. Cuidado de las mamas  Para ayudar a Pharmacologistmantener sus mamas sanas y Automotive engineerevitar que se resequen, haga lo siguiente: Evite usar Eaton Corporationjabn en los pezones. Seque al Hovnanian Enterprisesaire los pezones durante 3 o 4minutos despus de amamantar al beb. Utilice solo discos absorbentes de algodn en el sostn para Insurance account managerabsorber la Mirantleche que se filtre. Asegrese de Costco Wholesalecambiar los discos si se empapan con Pomonaleche. Si Bots[place]ausa discos en el sostn que se pueden Technical brewerdesechar, cmbielos con frecuencia. Pngase lanolina sobre los pezones luego de Museum/gallery exhibitions officeramamantar. Si Botswanausa lanolina pura, no tiene que lavarse los pezones antes de alimentar al beb nuevamente. La lanolina pura no es perjudicial para el beb. Frtese los pezones con WPS Resourcesleche materna: Saque con la mano algunas gotas de Auburnleche materna. Masajee suavemente la ARAMARK Corporationleche por los pezones. Deje secar los pezones al aire. Use un sostn para amamantar. Evite usar: Ropa ajustada. Sostenes con aro o sostenes que CarMaxpresionen las mamas. Aplquese hielo para Engineer, materialsaliviar el dolor o la inflamacin de las mamas: Ponga el hielo en una bolsa plstica. Coloque una toalla entre la piel y la bolsa de hielo. Coloque el hielo durante 20minutos, 2 a 3veces por Futures traderda. Instrucciones generales Beba suficiente lquido para mantener la orina de color amarillo plido. Descanse lo suficiente. Duerma mientras el beb duerme. Consulte al mdico o a un especialista en lactancia antes de tomar suplementos a base de hierbas. Comunquese con un mdico si: Tiene dolor en los pezones. Presenta agrietamiento o irritacin en  los pezones durante ms de 1semana. Tiene las mamas llenas de leche en exceso (congestin mamaria) durante ms de 48 horas. Tiene fiebre. Le sale un lquido parecido al pus del pezn. Presenta enrojecimiento, una erupcin, hinchazn, picazn o ardor en las mamas. Su beb no aumenta de peso. Su beb pierde peso. Resumen Hay ciertas cosas que puede hacer para cuidarse y ayudar a prevenir muchos problemas frecuentes de Patent examinerla lactancia. Asegrese siempre de que el beb agarre bien el pezn con su boca (se prenda) cuando lo amamante. Evite que sus pezones se resequen, beba mucho lquido y descanse lo suficiente. Amamante al beb a demanda. No retrase los horarios para Museum/gallery exhibitions officeramamantar. Esta informacin no tiene Theme park managercomo fin reemplazar el consejo del mdico. Asegrese de hacerle al mdico cualquier pregunta que tenga. Document Released: 07/29/2017 Document Revised: 07/29/2017 Document Reviewed: 07/29/2017 Elsevier Patient Education  2020 Arvin[employer]itor[employer]vier Inc.

## 2019-08-30 NOTE — Progress Notes (Signed)
Patient discharged home with infant. Discharge instructions and prescriptions given and reviewed with patient. Patient verbalized understanding. Escorted out by staff.  

## 2019-09-04 ENCOUNTER — Other Ambulatory Visit: Payer: Self-pay

## 2019-09-04 ENCOUNTER — Ambulatory Visit (INDEPENDENT_AMBULATORY_CARE_PROVIDER_SITE_OTHER): Payer: 59 | Admitting: Obstetrics & Gynecology

## 2019-09-04 ENCOUNTER — Encounter: Payer: Self-pay | Admitting: Obstetrics & Gynecology

## 2019-09-04 NOTE — Progress Notes (Signed)
  Postoperative Follow-up Patient presents post op from recent Cesarean Section performed for Elective repeat, 1 week ago. Breast feeding Infant doing well  Subjective: Patient reports marked improvement in her immediate post op symptoms. Eating a regular diet without difficulty. The patient is not having any pain.  Activity: normal activities of daily living. Patient reports additional symptom's since surgery of appropriate lochia, no signs of depression, and no signs of mastitis.  Objective: BP 120/80   Ht 4\' 11"  (1.499 m)   Wt 152 lb (68.9 kg)   BMI 30.70 kg/m  Physical Exam Constitutional:      General: She is not in acute distress.    Appearance: She is well-developed.  Cardiovascular:     Rate and Rhythm: Normal rate.  Pulmonary:     Effort: Pulmonary effort is normal.  Abdominal:     General: There is no distension.     Palpations: Abdomen is soft.     Tenderness: There is no abdominal tenderness.     Comments: Incision Healing Well   Musculoskeletal: Normal range of motion.  Neurological:     Mental Status: She is alert and oriented to person, place, and time.     Cranial Nerves: No cranial nerve deficit.  Skin:    General: Skin is warm and dry.   Removed dressing and OnQ pain pump  Assessment: s/p : Cesarean Section for Elective repeat stable  Plan: Patient has done well after her Cesarean Section with no apparent complications.  I have discussed the post-operative course to date, and the expected progress moving forward.  The patient understands what complications to be concerned about.  I will see the patient in routine follow up, or sooner if needed.    Activity plan: No heavy lifting.Marland Kitchen  Pelvic rest. She desires vasectomy planning for husband for postpartum contraception.  Hoyt Koch 09/04/2019, 2:15 PM

## 2019-09-08 ENCOUNTER — Inpatient Hospital Stay: Admission: RE | Admit: 2019-09-08 | Payer: Self-pay | Source: Ambulatory Visit

## 2019-09-13 ENCOUNTER — Encounter: Payer: 59 | Admitting: Obstetrics and Gynecology

## 2019-09-15 ENCOUNTER — Other Ambulatory Visit: Payer: 59

## 2019-09-19 ENCOUNTER — Inpatient Hospital Stay: Admit: 2019-09-19 | Payer: 59 | Admitting: Obstetrics and Gynecology

## 2019-09-19 SURGERY — Surgical Case
Anesthesia: Choice

## 2019-09-21 LAB — BLOOD GAS, ARTERIAL
Acid-base deficit: 1.7 mmol/L (ref 0.0–2.0)
Bicarbonate: 23 mmol/L (ref 20.0–28.0)
FIO2: 0.21
O2 Saturation: 97 %
Patient temperature: 37
pCO2 arterial: 38 mmHg (ref 32.0–48.0)
pH, Arterial: 7.39 (ref 7.350–7.450)
pO2, Arterial: 91 mmHg (ref 83.0–108.0)

## 2019-10-09 ENCOUNTER — Ambulatory Visit (INDEPENDENT_AMBULATORY_CARE_PROVIDER_SITE_OTHER): Payer: 59 | Admitting: Obstetrics & Gynecology

## 2019-10-09 ENCOUNTER — Other Ambulatory Visit (HOSPITAL_COMMUNITY)
Admission: RE | Admit: 2019-10-09 | Discharge: 2019-10-09 | Disposition: A | Discharge status: Home / Self Care | Payer: 59 | Source: Ambulatory Visit | Attending: Obstetrics & Gynecology | Admitting: Obstetrics & Gynecology

## 2019-10-09 ENCOUNTER — Other Ambulatory Visit: Payer: Self-pay

## 2019-10-09 ENCOUNTER — Encounter: Payer: Self-pay | Admitting: Obstetrics & Gynecology

## 2019-10-09 DIAGNOSIS — Z124 Encounter for screening for malignant neoplasm of cervix: Secondary | ICD-10-CM | POA: Diagnosis not present

## 2019-10-09 DIAGNOSIS — N9489 Other specified conditions associated with female genital organs and menstrual cycle: Secondary | ICD-10-CM

## 2019-10-09 NOTE — Patient Instructions (Signed)
Transvaginal Ultrasound A transvaginal ultrasound, also called an endovaginal ultrasound, is a test that uses sound waves to take pictures of the female genital tract. The pictures are taken with a device, called a transducer, that is placed in the vagina. This test may be done to:  Check for problems with your pregnancy.  Check your developing baby.  Check for anything abnormal in the uterus or ovaries.  Find out why you have pelvic pain or bleeding. Tell a health care provider about:  Any allergies you have.  All medicines you are taking, including vitamins, herbs, eye drops, creams, and over-the-counter medicines.  Any blood disorders you have.  Any surgeries you have had.  Any medical conditions you have.  Whether you are pregnant or may be pregnant.  Whether you are having your menstrual period. What are the risks? This is a safe procedure. There are no known risks or complications of having this test. What happens before the procedure? This procedure needs to be done when your bladder is empty. Follow your health care provider's instructions about drinking fluids and emptying your bladder before the test. What happens during the procedure?   You will empty your bladder before the procedure.  You will undress from the waist down.  You will lie down on an exam table, with your knees bent and your feet in foot holders.  A health care provider will cover the transducer with a sterile cover.  A gel will be put on the transducer. The gel helps transmit the sound waves and prevents irritation of your vagina.  The technician will insert the transducer into your vagina to get images. These will be displayed on a monitor that looks like a small television screen.  The transducer will be removed when the procedure is complete. The procedure may vary among health care providers and hospitals. What happens after the procedure?  It is up to you to get the results of your  procedure. Ask your health care provider, or the department that is doing the procedure, when your results will be ready.  Keep all follow-up visits as told by your health care provider. This is important. Summary  A transvaginal ultrasound, also called an endovaginal ultrasound, is a test that uses sound waves to take pictures of the female genital tract.  This is a safe procedure. There are no known risks associated with this test.  The procedure needs to be done when your bladder is empty. Follow your health care provider's instructions about drinking fluids and emptying your bladder before the test.  During the procedure, you will undress from the waist down and lie down on an exam table. A technician will insert a transducer into your vagina to obtain images.  Ask your health care provider, or the department that is doing the procedure, when your results will be ready. This information is not intended to replace advice given to you by your health care provider. Make sure you discuss any questions you have with your health care provider. Document Released: 10/14/2004 Document Revised: 06/15/2018 Document Reviewed: 06/15/2018 Elsevier Patient Education  2020 Elsevier Inc.  

## 2019-10-09 NOTE — Progress Notes (Signed)
  OBSTETRICS POSTPARTUM CLINIC PROGRESS NOTE  Subjective:     Destiny Allen is a 33 y.o. [phone] female who presents for a postpartum visit. She is 6 weeks postpartum following a Term pregnancy and delivery by C-section.  I have fully reviewed the prenatal and intrapartum course. Anesthesia: spinal.  Postpartum course has been complicated by uncomplicated.  Baby is feeding by Bottle.  Bleeding: patient has not  resumed menses.  Bowel function is normal. She does report constipation severe and blood in stool at times. Also reports LLQ pain at times, often when void/post-void Bladder function is normal.  Patient is not sexually active. Contraception method desired is vasectomy.  Postpartum depression screening: negative. [place] 0.  The following portions of the patient's history were reviewed and updated as appropriate: allergies, current medications, past family history, past medical history, past social history, past surgical history and problem list.  Review of Systems Pertinent items are noted in HPI.  Objective:    BP 120/80   Temp (!) 97.2 F (36.2 C)   Ht 4\' 11"  (1.499 m)   Wt 142 lb (64.4 kg)   BMI 28.68 kg/m   General:  alert and no distress   Breasts:  inspection negative, no nipple discharge or bleeding, no masses or nodularity palpable  Lungs: clear to auscultation bilaterally  Heart:  regular rate and rhythm, S1, S2 normal, no murmur, click, rub or gallop  Abdomen: soft, non-tender; bowel sounds normal; no masses,  no organomegaly.  Well healed Pfannenstiel incision   Vulva:  normal  Vagina: normal vagina, no discharge, exudate, lesion, or erythema  Cervix:  no cervical motion tenderness and no lesions  Corpus: normal size, contour, position, consistency, mobility, non-tender  Adnexa:  Normal right adnexa and no mass, fullness, tenderness Left sided tender 3 cm mass mobile  Rectal Exam: No int/ext hemorrhoids, no mass, no blood          Assessment:  Post  Partum Care visit 1. Postpartum care following cesarean delivery  2. Screening for cervical cancer - Cytology - PAP  3. Adnexal mass - US PELVIC COMPLETE WITH TRANSVAGINAL; Future  4. Constipation- Colace twice daily Exam today shows no evisnece for hemorrhoids  Plan:  See orders and Patient Instructions Resume all normal activities [place] to assess mass felt on exam and due to pain Follow up in: 1 week or as needed.   Barnett Applebaum, MD, [facility], [facility] 10/09/2019  8:39 AM

## 2019-10-10 LAB — CYTOLOGY - PAP
Comment: NEGATIVE
Diagnosis: NEGATIVE
High risk HPV: NEGATIVE

## 2019-10-30 ENCOUNTER — Ambulatory Visit (INDEPENDENT_AMBULATORY_CARE_PROVIDER_SITE_OTHER): Payer: 59 | Admitting: Obstetrics & Gynecology

## 2019-10-30 ENCOUNTER — Ambulatory Visit (INDEPENDENT_AMBULATORY_CARE_PROVIDER_SITE_OTHER): Payer: 59

## 2019-10-30 ENCOUNTER — Other Ambulatory Visit: Payer: Self-pay

## 2019-10-30 ENCOUNTER — Encounter: Payer: Self-pay | Admitting: Obstetrics & Gynecology

## 2019-10-30 VITALS — BP 120/70 | Ht 59.0 in | Wt 142.0 lb

## 2019-10-30 DIAGNOSIS — N83202 Unspecified ovarian cyst, left side: Secondary | ICD-10-CM | POA: Insufficient documentation

## 2019-10-30 DIAGNOSIS — Z3041 Encounter for surveillance of contraceptive pills: Secondary | ICD-10-CM | POA: Diagnosis not present

## 2019-10-30 DIAGNOSIS — R1032 Left lower quadrant pain: Secondary | ICD-10-CM | POA: Insufficient documentation

## 2019-10-30 DIAGNOSIS — N9489 Other specified conditions associated with female genital organs and menstrual cycle: Secondary | ICD-10-CM | POA: Diagnosis not present

## 2019-10-30 DIAGNOSIS — R5383 Other fatigue: Secondary | ICD-10-CM | POA: Insufficient documentation

## 2019-10-30 MED ORDER — NORETHINDRONE 0.35 MG PO TABS: 1.0000 | ORAL_TABLET | Freq: Every day | ORAL | 11 refills | Status: DC

## 2019-10-30 NOTE — Progress Notes (Signed)
HPI: Pt has had some LLQ pain off and on since delivery.  Last exam there was a left adnexal mass palpated.  No bloating, nausea, bleeding; no period yet; breast feeding.  She also reports fatihue and tiredness.  EDesires OCP for contraception.  Ultrasound demonstrates cyst seen about 2 cm left ovary.  See below.  PMHx: She  has a past medical history of Infertility associated with anovulation and Ovarian cyst. Also,  has a past surgical history that includes Cesarean section (2011); Dilation and evacuation (N/A, 11/12/2017); and Cesarean section (N/A, 08/28/2019)., family history includes Hypertension in her father.,  reports that she has never smoked. She has never used smokeless tobacco. She reports that she does not drink alcohol or use drugs.  She has a current medication list which includes the following prescription(s): calcium carbonate-vit d-min, fish oil, prenatal, vitamin c, and norethindrone. Also, has No Known Allergies.  Review of Systems  All other systems reviewed and are negative.   Objective: BP 120/70   Ht 4\' 11"  (1.499 m)   Wt 142 lb (64.4 kg)   BMI 28.68 kg/m   Physical examination Constitutional NAD, Conversant  Skin No rashes, lesions or ulceration.   Extremities: Moves all appropriately.  Normal ROM for age. No lymphadenopathy.  Neuro: Grossly intact  Psych: Oriented to PPT.  Normal mood. Normal affect.   PELVIS TRANSVAGINAL NON-OB (TV ONLY)  Result Date: 10/30/2019 Patient Name: Destiny Allen DOB: December 22, 1985 MRN: [id number] ULTRASOUND REPORT Location: [id number] Date of Service: 10/30/2019 Indications:Pelvic Pain Findings: The uterus is anteverted and measures 9.7 X 5.3 X 6.6 CM. Echo texture is homogenous without evidence of focal masses. The Endometrium measures 10 mm. Right Ovary measures 3.7 X 2.2 X 2.1 cm. It is normal in appearance. Left Ovary measures 3.5 X 2.5 X 2.7 cm. It is normal in appearance. Simple cyst measuring 1.9 x 1.6 x 1.7 cm Survey  of the adnexa demonstrates no adnexal masses. There is no free fluid in the cul de sac. Impression: 1. Lt ovarian cyst as described above. Recommendations: 1.Clinical correlation with the patient's History and Physical Exam. Jenine M. 11/01/2019    RDMS Review of ULTRASOUND.    I have personally reviewed images and report of recent ultrasound done at [facility].    Plan of management to be discussed with patient. [facility], MD, FACOG [facility], [facility] 10/30/2019  1:46 PM   Assessment:  Cyst of left ovary - Plan: 11/01/2019 PELVIC COMPLETE WITH TRANSVAGINAL  LLQ pain - Plan: US PELVIC COMPLETE WITH TRANSVAGINAL    For follow up    Also monitor for sx's of change w pain, bloating  Fatigue, unspecified type - Plan: Hemoglobin, TSH  Encounter for surveillance of contraceptive pills -     Plan: norethindrone (MICRONOR) 0.35 MG tablet    Change to OCP when stops breast feeding    OCPs The risks /benefits of OCPs have been explained to the patient in detail.  Product literature has been given to her.  I have instructed her in the use of OCPs and have given her literature reinforcing this information.  I have explained to the patient that OCPs are not as effective for birth control during the first month of use, and that another form of contraception should be used during this time.  Both first-day start and Sunday start have been explained.  The risks and benefits of each was discussed.  She has been made aware of  the fact  that other medications may affect the efficacy of OCPs.  I have answered all of her questions, and I believe that she has an understanding of the effectiveness and use of OCPs.   A total of 15 minutes were spent face-to-face with the patient during this encounter and over half of that time dealt with counseling and coordination of care.  Barnett Applebaum, MD, [facility], [facility] 10/30/2019  1:55 PM

## 2019-10-30 NOTE — Patient Instructions (Signed)
Ovarian Cyst     An ovarian cyst is a fluid-filled sac that forms on an ovary. The ovaries are small organs that produce eggs in women. Various types of cysts can form on the ovaries. Some may cause symptoms and require treatment. Most ovarian cysts go away on their own, are not cancerous (are benign), and do not cause problems. Common types of ovarian cysts include:  Functional (follicle) cysts. ? Occur during the menstrual cycle, and usually go away with the next menstrual cycle if you do not get pregnant. ? Usually cause no symptoms.  Endometriomas. ? Are cysts that form from the tissue that lines the uterus (endometrium). ? Are sometimes called "chocolate cysts" because they become filled with blood that turns brown. ? Can cause pain in the lower abdomen during intercourse and during your period.  Cystadenoma cysts. ? Develop from cells on the outside surface of the ovary. ? Can get very large and cause lower abdomen pain and pain with intercourse. ? Can cause severe pain if they twist or break open (rupture).  Dermoid cysts. ? Are sometimes found in both ovaries. ? May contain different kinds of body tissue, such as skin, teeth, hair, or cartilage. ? Usually do not cause symptoms unless they get very big.  Theca lutein cysts. ? Occur when too much of a certain hormone (human chorionic gonadotropin) is produced and overstimulates the ovaries to produce an egg. ? Are most common after having procedures used to assist with the conception of a baby (in vitro fertilization). What are the causes? Ovarian cysts may be caused by:  Ovarian hyperstimulation syndrome. This is a condition that can develop from taking fertility medicines. It causes multiple large ovarian cysts to form.  Polycystic ovarian syndrome (PCOS). This is a common hormonal disorder that can cause ovarian cysts, as well as problems with your period or fertility. What increases the risk? The following factors may  make you more likely to develop ovarian cysts:  Being overweight or obese.  Taking fertility medicines.  Taking certain forms of hormonal birth control.  Smoking. What are the signs or symptoms? Many ovarian cysts do not cause symptoms. If symptoms are present, they may include:  Pelvic pain or pressure.  Pain in the lower abdomen.  Pain during sex.  Abdominal swelling.  Abnormal menstrual periods.  Increasing pain with menstrual periods. How is this diagnosed? These cysts are commonly found during a routine pelvic exam. You may have tests to find out more about the cyst, such as:  Ultrasound.  X-ray of the pelvis.  CT scan.  MRI.  Blood tests. How is this treated? Many ovarian cysts go away on their own without treatment. Your health care provider may want to check your cyst regularly for 2-3 months to see if it changes. If you are in menopause, it is especially important to have your cyst monitored closely because menopausal women have a higher rate of ovarian cancer. When treatment is needed, it may include:  Medicines to help relieve pain.  A procedure to drain the cyst (aspiration).  Surgery to remove the whole cyst.  Hormone treatment or birth control pills. These methods are sometimes used to help dissolve a cyst. Follow these instructions at home:  Take over-the-counter and prescription medicines only as told by your health care provider.  Do not drive or use heavy machinery while taking prescription pain medicine.  Get regular pelvic exams and Pap tests as often as told by your health care provider.    Return to your normal activities as told by your health care provider. Ask your health care provider what activities are safe for you.  Do not use any products that contain nicotine or tobacco, such as cigarettes and e-cigarettes. If you need help quitting, ask your health care provider.  Keep all follow-up visits as told by your health care provider.  This is important. Contact a health care provider if:  Your periods are late, irregular, or painful, or they stop.  You have pelvic pain that does not go away.  You have pressure on your bladder or trouble emptying your bladder completely.  You have pain during sex.  You have any of the following in your abdomen: ? A feeling of fullness. ? Pressure. ? Discomfort. ? Pain that does not go away. ? Swelling.  You feel generally ill.  You become constipated.  You lose your appetite.  You develop severe acne.  You start to have more body hair and facial hair.  You are gaining weight or losing weight without changing your exercise and eating habits.  You think you may be pregnant. Get help right away if:  You have abdominal pain that is severe or gets worse.  You cannot eat or drink without vomiting.  You suddenly develop a fever.  Your menstrual period is much heavier than usual. This information is not intended to replace advice given to you by your health care provider. Make sure you discuss any questions you have with your health care provider. Document Released: 11/02/2005 Document Revised: 01/31/2018 Document Reviewed: 04/05/2016 Elsevier Patient Education  2020 Elsevier Inc.  

## 2019-10-31 ENCOUNTER — Encounter: Payer: Self-pay | Admitting: Obstetrics & Gynecology

## 2019-10-31 LAB — TSH: TSH: 1.39 u[IU]/mL (ref 0.450–4.500)

## 2019-10-31 LAB — HEMOGLOBIN: Hemoglobin: 12.8 g/dL (ref 11.1–15.9)

## 2019-12-20 ENCOUNTER — Ambulatory Visit (INDEPENDENT_AMBULATORY_CARE_PROVIDER_SITE_OTHER): Payer: 59 | Admitting: Obstetrics & Gynecology

## 2019-12-20 ENCOUNTER — Encounter: Payer: Self-pay | Admitting: Obstetrics & Gynecology

## 2019-12-20 ENCOUNTER — Ambulatory Visit (INDEPENDENT_AMBULATORY_CARE_PROVIDER_SITE_OTHER): Payer: 59

## 2019-12-20 ENCOUNTER — Other Ambulatory Visit: Payer: Self-pay

## 2019-12-20 ENCOUNTER — Other Ambulatory Visit: Payer: Self-pay | Admitting: Obstetrics & Gynecology

## 2019-12-20 VITALS — BP 120/80 | Ht 59.0 in | Wt 137.0 lb

## 2019-12-20 DIAGNOSIS — N83202 Unspecified ovarian cyst, left side: Secondary | ICD-10-CM

## 2019-12-20 DIAGNOSIS — Z3041 Encounter for surveillance of contraceptive pills: Secondary | ICD-10-CM

## 2019-12-20 DIAGNOSIS — R1032 Left lower quadrant pain: Secondary | ICD-10-CM

## 2019-12-20 MED ORDER — NORETHIN ACE-ETH ESTRAD-FE 1-20 MG-MCG(24) PO TABS: 1.0000 | ORAL_TABLET | Freq: Every day | ORAL | 3 refills | Status: DC

## 2019-12-20 NOTE — Patient Instructions (Signed)
Ethinyl Estradiol; Norgestimate tablets What is this medicine? ETHINYL ESTRADIOL; NORGESTIMATE (ETH in il es tra DYE ole; nor JES ti mate) is an oral contraceptive. The products combine two types of female hormones, an estrogen and a progestin. They are used to prevent ovulation and pregnancy. Some products are also used to treat acne in females. This medicine may be used for other purposes; ask your health care provider or pharmacist if you have questions. COMMON BRAND NAME(S): Estarylla, Mili, MONO-LINYAH, MonoNessa, Norgestimate/Ethinyl Estradiol, Ortho Tri-Cyclen, Ortho Tri-Cyclen Lo, Ortho-Cyclen, Previfem, Sprintec, Tri-Estarylla, TRI-LINYAH, Tri-Lo-Estarylla, Tri-Lo-Marzia, Tri-Lo-Mili, Tri-Lo-Sprintec, Tri-Mili, Tri-Previfem, Tri-Sprintec, Tri-VyLibra, Trinessa, Trinessa Lo, VyLibra What should I tell my health care provider before I take this medicine? They need to know if you have or ever had any of these conditions:  abnormal vaginal bleeding  blood vessel disease or blood clots  breast, cervical, endometrial, ovarian, liver, or uterine cancer  diabetes  gallbladder disease  heart disease or recent heart attack  high blood pressure  high cholesterol  kidney disease  liver disease  migraine headaches  stroke  systemic lupus erythematosus (SLE)  tobacco smoker  an unusual or allergic reaction to estrogens, progestins, other medicines, foods, dyes, or preservatives  pregnant or trying to get pregnant  breast-feeding How should I use this medicine? Take this medicine by mouth. To reduce nausea, this medicine may be taken with food. Follow the directions on the prescription label. Take this medicine at the same time each day and in the order directed on the package. Do not take your medicine more often than directed. Contact your pediatrician regarding the use of this medicine in children. Special care may be needed. This medicine has been used in female children who  have started having menstrual periods. A patient package insert for the product will be given with each prescription and refill. Read this sheet carefully each time. The sheet may change frequently. Overdosage: If you think you have taken too much of this medicine contact a poison control center or emergency room at once. NOTE: This medicine is only for you. Do not share this medicine with others. What if I miss a dose? If you miss a dose, refer to the patient information sheet you received with your medicine for direction. If you miss more than one pill, this medicine may not be as effective and you may need to use another form of birth control. What may interact with this medicine? Do not take this medicine with the following medication:  dasabuvir; ombitasvir; paritaprevir; ritonavir  ombitasvir; paritaprevir; ritonavir This medicine may also interact with the following medications:  acetaminophen  antibiotics or medicines for infections, especially rifampin, rifabutin, rifapentine, and griseofulvin, and possibly penicillins or tetracyclines  aprepitant  ascorbic acid (vitamin C)  atorvastatin  barbiturate medicines, such as phenobarbital  bosentan  carbamazepine  caffeine  clofibrate  cyclosporine  dantrolene  doxercalciferol  felbamate  grapefruit juice  hydrocortisone  medicines for anxiety or sleeping problems, such as diazepam or temazepam  medicines for diabetes, including pioglitazone  mineral oil  modafinil  mycophenolate  nefazodone  oxcarbazepine  phenytoin  prednisolone  ritonavir or other medicines for HIV infection or AIDS  rosuvastatin  selegiline  soy isoflavones supplements  St. John's wort  tamoxifen or raloxifene  theophylline  thyroid hormones  topiramate  warfarin This list may not describe all possible interactions. Give your health care provider a list of all the medicines, herbs, non-prescription drugs, or  dietary supplements you use. Also tell them if   you smoke, drink alcohol, or use illegal drugs. Some items may interact with your medicine. What should I watch for while using this medicine? Visit your doctor or health care professional for regular checks on your progress. You will need a regular breast and pelvic exam and Pap smear while on this medicine. You should also discuss the need for regular mammograms with your health care professional, and follow his or her guidelines for these tests. This medicine can make your body retain fluid, making your fingers, hands, or ankles swell. Your blood pressure can go up. Contact your doctor or health care professional if you feel you are retaining fluid. Use an additional method of contraception during the first cycle that you take these tablets. If you have any reason to think you are pregnant, stop taking this medicine right away and contact your doctor or health care professional. If you are taking this medicine for hormone related problems, it may take several cycles of use to see improvement in your condition. Do not use this product if you smoke and are over 35 years of age. Smoking increases the risk of getting a blood clot or having a stroke while you are taking birth control pills, especially if you are more than 35 years old. If you are a smoker who is 35 years of age or younger, you are strongly advised not to smoke while taking birth control pills. This medicine can make you more sensitive to the sun. Keep out of the sun. If you cannot avoid being in the sun, wear protective clothing and use sunscreen. Do not use sun lamps or tanning beds/booths. If you wear contact lenses and notice visual changes, or if the lenses begin to feel uncomfortable, consult your eye care specialist. In some women, tenderness, swelling, or minor bleeding of the gums may occur. Notify your dentist if this happens. Brushing and flossing your teeth regularly may help limit  this. See your dentist regularly and inform your dentist of the medicines you are taking. If you are going to have elective surgery, you may need to stop taking this medicine before the surgery. Consult your health care professional for advice. This medicine does not protect you against HIV infection (AIDS) or any other sexually transmitted diseases. What side effects may I notice from receiving this medicine? Side effects that you should report to your doctor or health care professional as soon as possible:  breast tissue changes or discharge  changes in vaginal bleeding during your period or between your periods  chest pain  coughing up blood  dizziness or fainting spells  headaches or migraines  leg, arm or groin pain  severe or sudden headaches  stomach pain (severe)  sudden shortness of breath  sudden loss of coordination, especially on one side of the body  speech problems  symptoms of vaginal infection like itching, irritation or unusual discharge  tenderness in the upper abdomen  vomiting  weakness or numbness in the arms or legs, especially on one side of the body  yellowing of the eyes or skin Side effects that usually do not require medical attention (report to your doctor or health care professional if they continue or are bothersome):  breakthrough bleeding and spotting that continues beyond the 3 initial cycles of pills  breast tenderness  mood changes, anxiety, depression, frustration, anger, or emotional outbursts  increased sensitivity to sun or ultraviolet light  nausea  skin rash, acne, or brown spots on the skin  weight gain (slight) This   list may not describe all possible side effects. Call your doctor for medical advice about side effects. You may report side effects to FDA at 1-800-FDA-1088. Where should I keep my medicine? Keep out of the reach of children. Store at room temperature between 15 and 30 degrees C (59 and 86 degrees F).  Throw away any unused medicine after the expiration date. NOTE: This sheet is a summary. It may not cover all possible information. If you have questions about this medicine, talk to your doctor, pharmacist, or health care provider.  2020 Elsevier/Gold Standard (2016-07-13 08:09:09)  

## 2019-12-20 NOTE — Progress Notes (Signed)
HPI: Pt is a 34 yo [id number] H F with prior Left Ovarian cyst for which she had pain from on the LLQ.  The pain has recently resolved.  No VB.  No period.  SHe is on POP for birth control, yet recently stopped breast feeding.  Ultrasound demonstrates no masses seen, resolved left ovarian cyst  PMHx: She  has a past medical history of Infertility associated with anovulation and Ovarian cyst. Also,  has a past surgical history that includes Cesarean section (2011); Dilation and evacuation (N/A, 11/12/2017); and Cesarean section (N/A, 08/28/2019)., family history includes Hypertension in her father.,  reports that she has never smoked. She has never used smokeless tobacco. She reports that she does not drink alcohol or use drugs.  She has a current medication list which includes the following prescription(s): calcium carbonate-vit d-min, norethindrone, fish oil, prenatal, vitamin c, and norethindrone acetate-ethinyl estrad-fe. Also, has No Known Allergies.  Review of Systems  All other systems reviewed and are negative.   Objective: BP 120/80   Ht 4\' 11"  (1.499 m)   Wt 137 lb (62.1 kg)   BMI 27.67 kg/m   Physical examination Constitutional NAD, Conversant  Skin No rashes, lesions or ulceration.   Extremities: Moves all appropriately.  Normal ROM for age. No lymphadenopathy.  Neuro: Grossly intact  Psych: Oriented to PPT.  Normal mood. Normal affect.   US PELVIS TRANSVAGINAL NON-OB (TV ONLY)  Result Date: 12/20/2019 Patient Name: Destiny Allen DOB: August 05, 1986 MRN: [id number] ULTRASOUND REPORT Location: [facility] Date of Service: 12/20/2019 Indications:Pelvic Pain Findings: The uterus is retroflexed and measures 8.8 x 5.8 x 4.3 cm. Echo texture is homogenous without evidence of focal masses. The Endometrium measures 6.5 mm. Right Ovary measures 4.6 x 2.4 x 2.4 cm. It is normal in appearance. Left Ovary measures 3.3 x 2.4 x 2.1 cm. It is normal in appearance. Survey of the adnexa  demonstrates no adnexal masses. There is no free fluid in the cul de sac. Impression: 1. Normal pelvic ultrasound. Recommendations: 1.Clinical correlation with the patient's History and Physical Exam. Gweneth Dimitri, RT Review of ULTRASOUND.    I have personally reviewed images and report of recent ultrasound done at [facility].    Plan of management to be discussed with patient. Barnett Applebaum, MD, [facility], [facility] 12/20/2019  3:04 PM   Assessment:  Cyst of left ovary    Resolved Encounter for surveillance of contraceptive pills    OCPs The risks /benefits of OCPs have been explained to the patient in detail.  Product literature has been given to her.  I have instructed her in the use of OCPs and have given her literature reinforcing this information.  I have explained to the patient that OCPs are not as effective for birth control during the first month of use, and that another form of contraception should be used during this time.  Both first-day start and Sunday start have been explained.  The risks and benefits of each was discussed.  She has been made aware of  the fact that other medications may affect the efficacy of OCPs.  I have answered all of her questions, and I believe that she has an understanding of the effectiveness and use of OCPs.  Will change to Lo Estin, samples and Rx done (for generic)  A total of 20 minutes were spent face-to-face with the patient as well as preparation, review, communication, and documentation during this encounter.   Barnett Applebaum, MD,  [facility], [facility] 12/20/2019  3:12 PM

## 2020-04-09 DIAGNOSIS — Z03818 Encounter for observation for suspected exposure to other biological agents ruled out: Secondary | ICD-10-CM | POA: Diagnosis not present

## 2020-05-15 ENCOUNTER — Encounter: Payer: Self-pay | Admitting: Obstetrics

## 2020-05-15 ENCOUNTER — Other Ambulatory Visit: Payer: Self-pay

## 2020-05-15 ENCOUNTER — Ambulatory Visit (INDEPENDENT_AMBULATORY_CARE_PROVIDER_SITE_OTHER): Payer: 59 | Admitting: Obstetrics

## 2020-05-15 ENCOUNTER — Other Ambulatory Visit (HOSPITAL_COMMUNITY)
Admission: RE | Admit: 2020-05-15 | Discharge: 2020-05-15 | Disposition: A | Discharge status: Home / Self Care | Payer: 59 | Source: Ambulatory Visit | Attending: Obstetrics | Admitting: Obstetrics

## 2020-05-15 VITALS — BP 110/80 | Wt 134.0 lb

## 2020-05-15 DIAGNOSIS — O09529 Supervision of elderly multigravida, unspecified trimester: Secondary | ICD-10-CM | POA: Insufficient documentation

## 2020-05-15 DIAGNOSIS — Z3A01 Less than 8 weeks gestation of pregnancy: Secondary | ICD-10-CM

## 2020-05-15 DIAGNOSIS — N912 Amenorrhea, unspecified: Secondary | ICD-10-CM | POA: Diagnosis not present

## 2020-05-15 LAB — POCT URINE PREGNANCY: Preg Test, Ur: POSITIVE — AB

## 2020-05-15 NOTE — Progress Notes (Signed)
NOB- no concerns ?

## 2020-05-15 NOTE — Addendum Note (Signed)
Addended by: Mirna Mires on: 05/15/2020 04:38 PM   Modules accepted: Orders

## 2020-05-15 NOTE — Progress Notes (Signed)
New Obstetric Patient H&P    Chief Complaint: "Desires prenatal care"   History of Present Illness: Patient is a 34 y.o. [id number] Hispanic or Latino female, LMP 03/21/2020 presents with amenorrhea and positive home pregnancy test. Based on her  LMP, her EDD is Estimated Date of Delivery: 12/26/20 and her EGA is [redacted]w[redacted]d. Cycles are 6. days, irregular, and occur approximately every : not applicable days. Her last pap smear was about 2 years ago and was no abnormalities.    She had a urine pregnancy test which was positive about 1 week(s)  ago. Her last menstrual period was normal and lasted for  6 day(s). Since her LMP she claims she has experienced nausea and headaches. She denies vaginal bleeding. Her past medical history is contibutory.she had a CS in October, after exerpiencing a molar pregnancy prior. This is a surprise pregnancy. Her prior pregnancies are notable for having had a molar pregnancy and  CSections.  Since her LMP, she admits to the use of tobacco products  no She claims she has gained   no pounds since the start of her pregnancy.  There are cats in the home in the home  no She admits close contact with children on a regular basis  yes  She has had chicken pox in the past no She has had Tuberculosis exposures, symptoms, or previously tested positive for TB   no Current or past history of domestic violence. no  Genetic Screening/Teratology Counseling: (Includes patient, baby's father, or anyone in either family with:)   1. Patient's age >/= 76 at [facility]  yes 2. Thalassemia ([place], [place], Mediterranean, or Asian background): MCV<80  no 3. Neural tube defect (meningomyelocele, spina bifida, anencephaly)  no 4. Congenital heart defect  no  5. Down syndrome  no 6. Tay-Sachs (Jewish, [place]  no 7. Canavan's Disease  no 8. Sickle cell disease or trait (African)  no  9. Hemophilia or other blood disorders  no  10. Muscular dystrophy  no  11. Cystic fibrosis  no    12. Huntington's Chorea  no  13. Mental retardation/autism  no 14. Other inherited genetic or chromosomal disorder  no 15. Maternal metabolic disorder (DM, PKU, etc)  no 16. Patient or FOB with a child with a birth defect not listed above no  16a. Patient or FOB with a birth defect themselves no 17. Recurrent pregnancy loss, or stillbirth  no  18. Any medications since LMP other than prenatal vitamins (include vitamins, supplements, OTC meds, drugs, alcohol)  no 19. Any other genetic/environmental exposure to discuss  no  Infection History:   1. Lives with someone with TB or TB exposed  no  2. Patient or partner has history of genital herpes  no 3. Rash or viral illness since LMP  no 4. History of STI (GC, CT, HPV, syphilis, HIV)  no 5. History of recent travel :  no  Other pertinent information:  Closely spaced pregnancies.     Review of Systems:10 point review of systems negative unless otherwise noted in HPI  Past Medical History:  Past Medical History:  Diagnosis Date  . Infertility associated with anovulation   . Ovarian cyst     Past Surgical History:  Past Surgical History:  Procedure Laterality Date  . CESAREAN SECTION  2011  . CESAREAN SECTION N/A 08/28/2019   Procedure: CESAREAN SECTION;  Surgeon: Nadara Mustard, MD;  Location: [facility] ORS;  Service: Obstetrics;  Laterality: N/A;  .  DILATION AND EVACUATION N/A 11/12/2017   Procedure: DILATATION AND EVACUATION;  Surgeon: Vena [place], MD;  Location: [facility] ORS;  Service: Gynecology;  Laterality: N/A;    Gynecologic History: Patient's last menstrual period was 03/21/2020 (exact date).  Obstetric History: [id number]  Family History:  Family History  Problem Relation Age of Onset  . Hypertension Father     Social History:  Social History   Socioeconomic History  . Marital status: Married    Spouse name: Not on file  . Number of children: Not on file  . Years of education: Not on file  . Highest  education level: Not on file  Occupational History  . Not on file  Tobacco Use  . Smoking status: Never Smoker  . Smokeless tobacco: Never Used  Vaping Use  . Vaping Use: Never used  Substance and Sexual Activity  . Alcohol use: No  . Drug use: No  . Sexual activity: Yes    Partners: Male    Birth control/protection: None  Other Topics Concern  . Not on file  Social History Narrative  . Not on file   Social Determinants of Health   Financial Resource Strain:   . Difficulty of Paying Living Expenses:   Food Insecurity:   . Worried About Programme researcher, broadcasting/film/video in the Last Year:   . Barista in the Last Year:   Transportation Needs:   . Freight forwarder (Medical):   Marland Kitchen Lack of Transportation (Non-Medical):   Physical Activity:   . Days of Exercise per Week:   . Minutes of Exercise per Session:   Stress:   . Feeling of Stress :   Social Connections:   . Frequency of Communication with Friends and Family:   . Frequency of Social Gatherings with Friends and Family:   . Attends Religious Services:   . Active Member of Clubs or Organizations:   . Attends Banker Meetings:   Marland Kitchen Marital Status:   Intimate Partner Violence:   . Fear of Current or Ex-Partner:   . Emotionally Abused:   Marland Kitchen Physically Abused:   . Sexually Abused:     Allergies:  No Known Allergies  Medications: Prior to Admission medications   Medication Sig Start Date End Date Taking? Authorizing Provider  Omega-3 Fatty Acids (FISH OIL) 1000 MG CAPS Take by mouth.   Yes [provider]  PRENATAL 28-0.8 MG TABS Take by mouth.   Yes [provider]  vitamin C (ASCORBIC ACID) 250 MG tablet Take 500 mg by mouth every other day.   Yes [provider]    Physical Exam Vitals: Blood pressure 110/80, weight 134 lb (60.8 kg), last menstrual period 03/21/2020, unknown if currently breastfeeding.  General: NAD HEENT: normocephalic, anicteric Thyroid: no  enlargement, no palpable nodules Pulmonary: No increased work of breathing, CTAB Cardiovascular: RRR, distal pulses 2+ Abdomen: NABS, soft, non-tender, non-distended.  Umbilicus without lesions.  No hepatomegaly, splenomegaly or masses palpable. No evidence of hernia  Genitourinary:  External: Normal external female genitalia.  Normal urethral meatus, normal  Bartholin's and Skene's glands.    Vagina: Normal vaginal mucosa, no evidence of prolapse.    Cervix: Grossly normal in appearance, no bleeding  Uterus: anteverted Non-enlarged, mobile, normal contour.  No CMT  Adnexa: ovaries non-enlarged, no adnexal masses  Rectal: deferred Extremities: no edema, erythema, or tenderness Neurologic: Grossly intact Psychiatric: mood appropriate, affect full   Assessment: 34 y.o. [id number] at [redacted]w[redacted]d presenting to initiate prenatal care  Plan: 1) Avoid alcoholic beverages. 2) Patient encouraged not to smoke.  3) Discontinue the use of all non-medicinal drugs and chemicals.  4) Take prenatal vitamins daily.  5) Nutrition, food safety (fish, cheese advisories, and high nitrite foods) and exercise discussed. 6) Hospital and practice style discussed with cross coverage system.  7) Genetic Screening, such as with 1st Trimester Screening, cell free fetal DNA, AFP testing, and Ultrasound, as well as with amniocentesis and CVS as appropriate, is discussed with patient. At the conclusion of today's visit patient requested genetic testing 8) Patient is asked about travel to areas at risk for the Zika virus, and counseled to avoid travel and exposure to mosquitoes or sexual partners who may have themselves been exposed to the virus. Testing is discussed, and will be ordered as appropriate.   As she is AMA, recommmend MaternT testing. Vitamin B6 and Unisom Sleep rec'd for nausea. RTC in lone week for sono for dating and bloodwork. No pap today (last in 2020) GC/CT retrieved. Mirna Mires, CNM  05/15/2020 4:14  PM

## 2020-05-17 LAB — CULTURE, OB URINE

## 2020-05-17 LAB — CERVICOVAGINAL ANCILLARY ONLY
Chlamydia: NEGATIVE
Comment: NEGATIVE
Comment: NORMAL
Neisseria Gonorrhea: NEGATIVE

## 2020-05-17 LAB — URINE CULTURE, OB REFLEX: Organism ID, Bacteria: NO GROWTH

## 2020-05-29 ENCOUNTER — Ambulatory Visit (INDEPENDENT_AMBULATORY_CARE_PROVIDER_SITE_OTHER): Payer: 59

## 2020-05-29 ENCOUNTER — Other Ambulatory Visit: Payer: Self-pay | Admitting: Obstetrics & Gynecology

## 2020-05-29 ENCOUNTER — Other Ambulatory Visit: Payer: Self-pay

## 2020-05-29 ENCOUNTER — Ambulatory Visit (INDEPENDENT_AMBULATORY_CARE_PROVIDER_SITE_OTHER): Payer: 59 | Admitting: Obstetrics

## 2020-05-29 ENCOUNTER — Encounter: Payer: Self-pay | Admitting: Obstetrics

## 2020-05-29 VITALS — BP 118/74 | Wt 134.0 lb

## 2020-05-29 DIAGNOSIS — Z3A09 9 weeks gestation of pregnancy: Secondary | ICD-10-CM

## 2020-05-29 DIAGNOSIS — Z3491 Encounter for supervision of normal pregnancy, unspecified, first trimester: Secondary | ICD-10-CM | POA: Diagnosis not present

## 2020-05-29 DIAGNOSIS — O0990 Supervision of high risk pregnancy, unspecified, unspecified trimester: Secondary | ICD-10-CM

## 2020-05-29 DIAGNOSIS — O09529 Supervision of elderly multigravida, unspecified trimester: Secondary | ICD-10-CM | POA: Diagnosis not present

## 2020-05-29 NOTE — Progress Notes (Signed)
Still having some nausea. No vb. No lof.

## 2020-05-29 NOTE — Progress Notes (Signed)
  Routine Prenatal Care Visit  Subjective  Destiny Allen is a 34 y.o. [id number] at [redacted]w[redacted]d being seen today for ongoing prenatal care.  She is currently monitored for the following issues for this high-risk pregnancy and has History of ectopic pregnancy; Partial molar pregnancy; Iron deficiency anemia; Mixed hyperlipidemia; Vitamin D deficiency; Impaired fasting glucose; Supervision of high risk pregnancy, antepartum; History of cesarean delivery affecting pregnancy; Antepartum multigravida of advanced maternal age; Positive GBS test; Cyst of left ovary; Fatigue; and LLQ pain on their problem list.  ----------------------------------------------------------------------------------- Patient reports fatigue and nausea.  She is beginning to feel more accepting of this pregnancy (surprise). She has not decided re :Fetal testing. Still has slight nausea.  . Vag. Bleeding: None.   . Leaking Fluid denies.  ----------------------------------------------------------------------------------- The following portions of the patient's history were reviewed and updated as appropriate: allergies, current medications, past family history, past medical history, past social history, past surgical history and problem list. Problem list updated.  Objective  Blood pressure 118/74, weight 134 lb (60.8 kg), last menstrual period 03/21/2020, unknown if currently breastfeeding. Pregravid weight 133 lb (60.3 kg) Total Weight Gain 1 lb (0.454 kg) Urinalysis: Urine Protein    Urine Glucose    Fetal Status:           General:  Alert, oriented and cooperative. Patient is in no acute distress.  Skin: Skin is warm and dry. No rash noted.   Cardiovascular: Normal heart rate noted  Respiratory: Normal respiratory effort, no problems with respiration noted  Abdomen: Soft, gravid, appropriate for gestational age. Pain/Pressure: Absent     Pelvic:  Cervical exam deferred        Extremities: Normal range of motion.     Mental Status:  Normal mood and affect. Normal behavior. Normal judgment and thought content.   Assessment   34 y.o. [id number] at [redacted]w[redacted]d by  12/26/2020, by Last Menstrual Period presenting for routine prenatal visit For OB panel bloodwork Ongoing nausea of pregnancy AM  Plan   SIXTH Problems (from 05/15/20 to present)    No problems associated with this episode.       Preterm labor symptoms and general obstetric precautions including but not limited to vaginal bleeding, contractions, leaking of fluid and fetal movement were reviewed in detail with the patient. Please refer to After Visit Summary for other counseling recommendations.  NOB panel, plus Vitamin D and Inheritest ordered Discussed option fro MaternT testing again with patient  Return in about 4 weeks (around 06/26/2020) for return OB.  Mirna Mires, CNM  05/29/2020 4:07 PM

## 2020-05-30 LAB — COMPREHENSIVE METABOLIC PANEL
ALT: 11 IU/L (ref 0–32)
AST: 13 IU/L (ref 0–40)
Albumin/Globulin Ratio: 1.8 (ref 1.2–2.2)
Albumin: 4.4 g/dL (ref 3.8–4.8)
Alkaline Phosphatase: 52 IU/L (ref 48–121)
BUN/Creatinine Ratio: 15 (ref 9–23)
BUN: 9 mg/dL (ref 6–20)
Bilirubin Total: 0.2 mg/dL (ref 0.0–1.2)
CO2: 23 mmol/L (ref 20–29)
Calcium: 9.7 mg/dL (ref 8.7–10.2)
Chloride: 103 mmol/L (ref 96–106)
Creatinine, Ser: 0.61 mg/dL (ref 0.57–1.00)
GFR calc Af Amer: 135 mL/min/{1.73_m2} (ref >59)
GFR calc non Af Amer: 117 mL/min/{1.73_m2} (ref >59)
Globulin, Total: 2.5 g/dL (ref 1.5–4.5)
Glucose: 78 mg/dL (ref 65–99)
Potassium: 4.2 mmol/L (ref 3.5–5.2)
Sodium: 139 mmol/L (ref 134–144)
Total Protein: 6.9 g/dL (ref 6.0–8.5)

## 2020-05-30 LAB — RPR+RH+ABO+RUB AB+AB SCR+CB...
Antibody Screen: NEGATIVE
HIV Screen 4th Generation wRfx: NONREACTIVE
Hematocrit: 38.4 % (ref 34.0–46.6)
Hemoglobin: 12.6 g/dL (ref 11.1–15.9)
Hepatitis B Surface Ag: NEGATIVE
MCH: 28.6 pg (ref 26.6–33.0)
MCHC: 32.8 g/dL (ref 31.5–35.7)
MCV: 87 fL (ref 79–97)
Platelets: 279 10*3/uL (ref 150–450)
RBC: 4.4 x10E6/uL (ref 3.77–5.28)
RDW: 13.9 % (ref 11.7–15.4)
RPR Ser Ql: NONREACTIVE
Rh Factor: POSITIVE
Rubella Antibodies, IGG: 26.3 index (ref >0.99)
Varicella zoster IgG: 2590 index (ref >165)
WBC: 9.8 10*3/uL (ref 3.4–10.8)

## 2020-05-30 LAB — VITAMIN D 25 HYDROXY (VIT D DEFICIENCY, FRACTURES): Vit D, 25-Hydroxy: 24.6 ng/mL — ABNORMAL LOW (ref 30.0–100.0)

## 2020-05-31 ENCOUNTER — Other Ambulatory Visit: Payer: Self-pay | Admitting: Obstetrics

## 2020-06-07 LAB — INHERITEST CORE(CF97,SMA,FRAX)

## 2020-06-28 ENCOUNTER — Ambulatory Visit (INDEPENDENT_AMBULATORY_CARE_PROVIDER_SITE_OTHER): Payer: 59 | Admitting: Advanced Practice Midwife

## 2020-06-28 ENCOUNTER — Other Ambulatory Visit: Payer: Self-pay

## 2020-06-28 ENCOUNTER — Encounter: Payer: Self-pay | Admitting: Advanced Practice Midwife

## 2020-06-28 VITALS — BP 116/68 | Wt 141.0 lb

## 2020-06-28 DIAGNOSIS — O09529 Supervision of elderly multigravida, unspecified trimester: Secondary | ICD-10-CM

## 2020-06-28 DIAGNOSIS — Z3A14 14 weeks gestation of pregnancy: Secondary | ICD-10-CM

## 2020-06-28 DIAGNOSIS — O09522 Supervision of elderly multigravida, second trimester: Secondary | ICD-10-CM

## 2020-06-28 DIAGNOSIS — O0992 Supervision of high risk pregnancy, unspecified, second trimester: Secondary | ICD-10-CM

## 2020-06-28 DIAGNOSIS — O0990 Supervision of high risk pregnancy, unspecified, unspecified trimester: Secondary | ICD-10-CM

## 2020-06-28 LAB — POCT URINALYSIS DIPSTICK OB: POC,PROTEIN,UA: NEGATIVE

## 2020-06-28 NOTE — Patient Instructions (Signed)

## 2020-06-28 NOTE — Progress Notes (Signed)
  Routine Prenatal Care Visit  Subjective  Destiny Allen is a 34 y.o. [id number] at [redacted]w[redacted]d being seen today for ongoing prenatal care.  She is currently monitored for the following issues for this high-risk pregnancy and has History of ectopic pregnancy; Partial molar pregnancy; Iron deficiency anemia; Mixed hyperlipidemia; Vitamin D deficiency; Impaired fasting glucose; Supervision of high risk pregnancy, antepartum; History of cesarean delivery affecting pregnancy; Antepartum multigravida of advanced maternal age; Positive GBS test; Cyst of left ovary; Fatigue; and LLQ pain on their problem list.  ----------------------------------------------------------------------------------- Patient reports no complaints.  She has decided not to do genetic screens.  . Vag. Bleeding: None.  Movement: Present. Leaking Fluid denies.   ----------------------------------------------------------------------------------- The following portions of the patient's history were reviewed and updated as appropriate: allergies, current medications, past family history, past medical history, past social history, past surgical history and problem list. Problem list updated.  Objective  Blood pressure 116/68, weight 141 lb (64 kg), last menstrual period 03/21/2020, unknown if currently breastfeeding. Pregravid weight 133 lb (60.3 kg) Total Weight Gain 8 lb (3.629 kg) Urinalysis: Urine Protein    Urine Glucose    Fetal Status: Fetal Heart Rate (bpm): 154   Movement: Present     General:  Alert, oriented and cooperative. Patient is in no acute distress.  Skin: Skin is warm and dry. No rash noted.   Cardiovascular: Normal heart rate noted  Respiratory: Normal respiratory effort, no problems with respiration noted  Abdomen: Soft, gravid, appropriate for gestational age.       Pelvic:  Cervical exam deferred        Extremities: Normal range of motion.     Mental Status: Normal mood and affect. Normal behavior. Normal judgment  and thought content.   Assessment   34 y.o. [id number] at [redacted]w[redacted]d by  12/26/2020, by Last Menstrual Period presenting for routine prenatal visit  Plan     Preterm labor symptoms and general obstetric precautions including but not limited to vaginal bleeding, contractions, leaking of fluid and fetal movement were reviewed in detail with the patient. Please refer to After Visit Summary for other counseling recommendations.   Return in about 4 weeks (around 07/26/2020) for anatomy scan and rob.  Tresea Mall, CNM 06/28/2020 4:43 PM

## 2020-06-28 NOTE — Progress Notes (Signed)
ROB

## 2020-07-26 ENCOUNTER — Ambulatory Visit (INDEPENDENT_AMBULATORY_CARE_PROVIDER_SITE_OTHER): Payer: 59 | Admitting: Obstetrics

## 2020-07-26 ENCOUNTER — Other Ambulatory Visit: Payer: Self-pay

## 2020-07-26 ENCOUNTER — Ambulatory Visit (INDEPENDENT_AMBULATORY_CARE_PROVIDER_SITE_OTHER): Payer: 59

## 2020-07-26 VITALS — BP 100/66 | Wt 145.0 lb

## 2020-07-26 DIAGNOSIS — O0990 Supervision of high risk pregnancy, unspecified, unspecified trimester: Secondary | ICD-10-CM | POA: Diagnosis not present

## 2020-07-26 DIAGNOSIS — Z3A18 18 weeks gestation of pregnancy: Secondary | ICD-10-CM | POA: Diagnosis not present

## 2020-07-26 LAB — POCT URINALYSIS DIPSTICK OB
Glucose, UA: NEGATIVE
POC,PROTEIN,UA: NEGATIVE

## 2020-07-26 NOTE — Progress Notes (Signed)
Routine Prenatal Care Visit  Subjective  Destiny Allen is a 34 y.o. [id number] at [redacted]w[redacted]d being seen today for ongoing prenatal care.  She is currently monitored for the following issues for this high-risk pregnancy and has History of ectopic pregnancy; Partial molar pregnancy; Iron deficiency anemia; Mixed hyperlipidemia; Vitamin D deficiency; Impaired fasting glucose; Supervision of high risk pregnancy, antepartum; History of cesarean delivery affecting pregnancy; Antepartum multigravida of advanced maternal age; Positive GBS test; Cyst of left ovary; Fatigue; and LLQ pain on their problem list.  ----------------------------------------------------------------------------------- Patient reports no complaints.    .  .   [name] Fluid denies.  ----------------------------------------------------------------------------------- The following portions of the patient's history were reviewed and updated as appropriate: allergies, current medications, past family history, past medical history, past social history, past surgical history and problem list. Problem list updated.  Objective  Blood pressure 100/66, weight 145 lb (65.8 kg), last menstrual period 03/21/2020, unknown if currently breastfeeding. Pregravid weight 133 lb (60.3 kg) Total Weight Gain 12 lb (5.443 kg) Urinalysis: Urine Protein Negative  Urine Glucose Negative  Fetal Status:           General:  Alert, oriented and cooperative. Patient is in no acute distress.  Skin: Skin is warm and dry. No rash noted.   Cardiovascular: Normal heart rate noted  Respiratory: Normal respiratory effort, no problems with respiration noted  Abdomen: Soft, gravid, appropriate for gestational age.       Pelvic:  Cervical exam deferred        Extremities: Normal range of motion.     Mental Status: Normal mood and affect. Normal behavior. Normal judgment and thought content.   Assessment   34 y.o. [id number] at [redacted]w[redacted]d by  12/26/2020, by Last Menstrual  Period presenting for routine prenatal visit  Plan   SIXTH Problems (from 05/15/20 to present)    Problem Noted Resolved   Supervision of high risk pregnancy, antepartum 02/21/2019 by Natale Milch, MD No   Overview Addendum 06/10/2020  8:28 AM by Mirna Mires, CNM      Clinic [facility] Prenatal Labs  Dating  5 wk [place] Blood type: A/Positive/-- (04/07 1636)   Genetic Screen Declined . inheritest negative   Antibody:Negative (04/07 1636)  Anatomic [place] Normal Rubella: 24.50 (04/07 1636)  Varicella: Immune  GTT Early: 71  28 wk:   173; 3 hour GTT: 87, 179, 161, 134   RPR: Non Reactive (04/07 1636)   Rhogam  not needed HBsAg: Negative (04/07 1636)   TDaP vaccine 07/28/19               HIV: Non Reactive (04/07 1636)   Flu Shot                                GBS:   Contraception Natural family planning Pap: NIL 2018  CBB     CS/VBAC  Hx of cesarean section 11/13   Baby Food Breast and bottle   Support Person  Renee           Previous Version       Preterm labor symptoms and general obstetric precautions including but not limited to vaginal bleeding, contractions, leaking of fluid and fetal movement were reviewed in detail with the patient. Please refer to After Visit Summary for other counseling recommendations.  Normal anatomy scan today. Gender is a surprise!  Return in about 1 month (around 08/25/2020) for return OB.  Mirna Mires, CNM  07/26/2020 4:56 PM

## 2020-07-26 NOTE — Progress Notes (Signed)
ROB/Anatomy- no concerns °

## 2020-07-31 ENCOUNTER — Telehealth: Payer: Self-pay

## 2020-07-31 NOTE — Telephone Encounter (Signed)
Pt calling; has questions about traveling.  [phone]  Pt has trip planned Nov 20th to [place]; she wasn't preg when she booked the trip; she is going to be gone 10d.  She will be around 28wks then; adv ultimately it was her decision; we don't want her to travel after 32 wks; can't predict if anything will  Become problematic between now and time of trip; if she does go to get up and walk around periodically, stay hydrated, and wear good supportive tennis shoes instead of cute shoes that matches her outfits.

## 2020-08-22 ENCOUNTER — Other Ambulatory Visit: Payer: Self-pay

## 2020-08-22 ENCOUNTER — Encounter: Payer: Self-pay | Admitting: Obstetrics and Gynecology

## 2020-08-22 ENCOUNTER — Ambulatory Visit (INDEPENDENT_AMBULATORY_CARE_PROVIDER_SITE_OTHER): Payer: 59 | Admitting: Obstetrics and Gynecology

## 2020-08-22 VITALS — BP 104/70 | Ht 59.0 in | Wt 149.6 lb

## 2020-08-22 DIAGNOSIS — O09529 Supervision of elderly multigravida, unspecified trimester: Secondary | ICD-10-CM

## 2020-08-22 DIAGNOSIS — O0990 Supervision of high risk pregnancy, unspecified, unspecified trimester: Secondary | ICD-10-CM

## 2020-08-22 DIAGNOSIS — Z3A22 22 weeks gestation of pregnancy: Secondary | ICD-10-CM

## 2020-08-22 LAB — POCT URINALYSIS DIPSTICK OB
Glucose, UA: NEGATIVE
POC,PROTEIN,UA: NEGATIVE

## 2020-08-22 NOTE — Patient Instructions (Addendum)
Second Trimester of Pregnancy The second trimester is from week 14 through week 27 (months 4 through 6). The second trimester is often a time when you feel your best. Your body has adjusted to being pregnant, and you begin to feel better physically. Usually, morning sickness has lessened or quit completely, you may have more energy, and you may have an increase in appetite. The second trimester is also a time when the fetus is growing rapidly. At the end of the sixth month, the fetus is about 9 inches long and weighs about 1 pounds. You will likely begin to feel the baby move (quickening) between 16 and 20 weeks of pregnancy. Body changes during your second trimester Your body continues to go through many changes during your second trimester. The changes vary from woman to woman.  Your weight will continue to increase. You will notice your lower abdomen bulging out.  You may begin to get stretch marks on your hips, abdomen, and breasts.  You may develop headaches that can be relieved by medicines. The medicines should be approved by your health care provider.  You may urinate more often because the fetus is pressing on your bladder.  You may develop or continue to have heartburn as a result of your pregnancy.  You may develop constipation because certain hormones are causing the muscles that push waste through your intestines to slow down.  You may develop hemorrhoids or swollen, bulging veins (varicose veins).  You may have back pain. This is caused by: ? Weight gain. ? Pregnancy hormones that are relaxing the joints in your pelvis. ? A shift in weight and the muscles that support your balance.  Your breasts will continue to grow and they will continue to become tender.  Your gums may bleed and may be sensitive to brushing and flossing.  Dark spots or blotches (chloasma, mask of pregnancy) may develop on your face. This will likely fade after the baby is born.  A dark line from your  belly button to the pubic area (linea nigra) may appear. This will likely fade after the baby is born.  You may have changes in your hair. These can include thickening of your hair, rapid growth, and changes in texture. Some women also have hair loss during or after pregnancy, or hair that feels dry or thin. Your hair will most likely return to normal after your baby is born. What to expect at prenatal visits During a routine prenatal visit:  You will be weighed to make sure you and the fetus are growing normally.  Your blood pressure will be taken.  Your abdomen will be measured to track your baby's growth.  The fetal heartbeat will be listened to.  Any test results from the previous visit will be discussed. Your health care provider may ask you:  How you are feeling.  If you are feeling the baby move.  If you have had any abnormal symptoms, such as leaking fluid, bleeding, severe headaches, or abdominal cramping.  If you are using any tobacco products, including cigarettes, chewing tobacco, and electronic cigarettes.  If you have any questions. Other tests that may be performed during your second trimester include:  Blood tests that check for: ? Low iron levels (anemia). ? High blood sugar that affects pregnant women (gestational diabetes) between 35 and 28 weeks. ? Rh antibodies. This is to check for a protein on red blood cells (Rh factor).  Urine tests to check for infections, diabetes, or protein in the  urine.  An ultrasound to confirm the proper growth and development of the baby.  An amniocentesis to check for possible genetic problems.  Fetal screens for spina bifida and Down syndrome.  HIV (human immunodeficiency virus) testing. Routine prenatal testing includes screening for HIV, unless you choose not to have this test. Follow these instructions at home: Medicines  Follow your health care provider's instructions regarding medicine use. Specific medicines may be  either safe or unsafe to take during pregnancy.  Take a prenatal vitamin that contains at least 600 micrograms (mcg) of folic acid.  If you develop constipation, try taking a stool softener if your health care provider approves. Eating and drinking   Eat a balanced diet that includes fresh fruits and vegetables, whole grains, good sources of protein such as meat, eggs, or tofu, and low-fat dairy. Your health care provider will help you determine the amount of weight gain that is right for you.  Avoid raw meat and uncooked cheese. These carry germs that can cause birth defects in the baby.  If you have low calcium intake from food, talk to your health care provider about whether you should take a daily calcium supplement.  Limit foods that are high in fat and processed sugars, such as fried and sweet foods.  To prevent constipation: ? Drink enough fluid to keep your urine clear or pale yellow. ? Eat foods that are high in fiber, such as fresh fruits and vegetables, whole grains, and beans. Activity  Exercise only as directed by your health care provider. Most women can continue their usual exercise routine during pregnancy. Try to exercise for 30 minutes at least 5 days a week. Stop exercising if you experience uterine contractions.  Avoid heavy lifting, wear low heel shoes, and practice good posture.  A sexual relationship may be continued unless your health care provider directs you otherwise. Relieving pain and discomfort  Wear a good support bra to prevent discomfort from breast tenderness.  Take warm sitz baths to soothe any pain or discomfort caused by hemorrhoids. Use hemorrhoid cream if your health care provider approves.  Rest with your legs elevated if you have leg cramps or low back pain.  If you develop varicose veins, wear support hose. Elevate your feet for 15 minutes, 3-4 times a day. Limit salt in your diet. Prenatal Care  Write down your questions. Take them to  your prenatal visits.  Keep all your prenatal visits as told by your health care provider. This is important. Safety  Wear your seat belt at all times when driving.  Make a list of emergency phone numbers, including numbers for family, friends, the hospital, and police and fire departments. General instructions  Ask your health care provider for a referral to a local prenatal education class. Begin classes no later than the beginning of month 6 of your pregnancy.  Ask for help if you have counseling or nutritional needs during pregnancy. Your health care provider can offer advice or refer you to specialists for help with various needs.  Do not use hot tubs, steam rooms, or saunas.  Do not douche or use tampons or scented sanitary pads.  Do not cross your legs for long periods of time.  Avoid cat litter boxes and soil used by cats. These carry germs that can cause birth defects in the baby and possibly loss of the fetus by miscarriage or stillbirth.  Avoid all smoking, herbs, alcohol, and unprescribed drugs. Chemicals in these products can affect the formation  and growth of the baby.  Do not use any products that contain nicotine or tobacco, such as cigarettes and e-cigarettes. If you need help quitting, ask your health care provider.  Visit your dentist if you have not gone yet during your pregnancy. Use a soft toothbrush to brush your teeth and be gentle when you floss. Contact a health care provider if:  You have dizziness.  You have mild pelvic cramps, pelvic pressure, or nagging pain in the abdominal area.  You have persistent nausea, vomiting, or diarrhea.  You have a bad smelling vaginal discharge.  You have pain when you urinate. Get help right away if:  You have a fever.  You are leaking fluid from your vagina.  You have spotting or bleeding from your vagina.  You have severe abdominal cramping or pain.  You have rapid weight gain or weight loss.  You have  shortness of breath with chest pain.  You notice sudden or extreme swelling of your face, hands, ankles, feet, or legs.  You have not felt your baby move in over an hour.  You have severe headaches that do not go away when you take medicine.  You have vision changes. Summary  The second trimester is from week 14 through week 27 (months 4 through 6). It is also a time when the fetus is growing rapidly.  Your body goes through many changes during pregnancy. The changes vary from woman to woman.  Avoid all smoking, herbs, alcohol, and unprescribed drugs. These chemicals affect the formation and growth your baby.  Do not use any tobacco products, such as cigarettes, chewing tobacco, and e-cigarettes. If you need help quitting, ask your health care provider.  Contact your health care provider if you have any questions. Keep all prenatal visits as told by your health care provider. This is important. This information is not intended to replace advice given to you by your health care provider. Make sure you discuss any questions you have with your health care provider. Document Revised: 02/24/2019 Document Reviewed: 12/08/2016 Elsevier Patient Education  2020 [employer].   Pregnancy and Travel  Most pregnant women can safely travel until the last month of their pregnancy. Your doctor may recommend limiting or avoiding travel depending on how far you are in the pregnancy, and if you have any medical or pregnancy problems. General travel tips Before you go:  Discuss your trip with your doctor. Get examined shortly before you go.  Get a copy of your medical records. Take it with you.  Try to get names of doctors and hospitals in the area where you will be visiting.  Pack your pillow.  Pack any approved medicines and supplements.  Get enough sleep the night before the trip. During your trip:  Ask for locations of doctors and hospitals.  Wear flat, comfortable shoes.  Wear  loose-fitting, comfortable clothes.  Wear compression stockings as told by your doctor. They may prevent blood clots that arise from sitting for a long time.  Do leg exercises as told by your doctor.  Eat a balanced diet, drink lots of fluid, and take your vitamins and supplements.  Take water, crackers, and fruit with you.  Take breaks to use the restroom and walk every 2 hours or during stops.  Do not wear yourself out.  Do not ride on a motorcycle.  Rest. If your trip is long, lie down for 30 or more minutes with your feet slightly raised after you reach your destination.  Always wear a seat belt. Tips for traveling to a foreign country Before you go:  Ask your doctor if there are medicines that are safe for you to take if you get diarrhea, constipation, nausea, or vomiting.  Check with your health insurance provider about medical coverage abroad. Purchase travel [employer], if needed.  Make sure you are up to date on vaccines. During your trip:  Do not eat uncooked foods.  Do not eat food from buffets or food that is cold or sitting at room temperature.  Drink bottled beverages and water. Do not use ice.  Wash fruits and vegetables with clean water. If possible, peel them before eating.  Do not drink unpasteurized milk.  Wear insect repellent if there are mosquitoes or other biting insects. Ask your doctor which repellents are safe. What do I need to know about traveling by car?  Wear your seat belt properly. The belt should be buckled below your abdomen, on your hip bones. The shoulder belt should be off to the side of your abdomen and across the center of your chest.  If you are in the front seat, sit as far away from the dashboard as possible to avoid getting hit hard if the airbag deploys in an accident.  Do not travel for more than 5-6 hours a day. What do I need to know about traveling by bus?  Before making a reservation, ask whether your bus will  have a restroom.  Move your arms and legs when seated.  If you have to use the restroom, hold on to the seats and handrails as you walk.  Do not travel for more than 5-6 hours a day. What do I need to know about traveling by train?  Before making a reservation, ask if your train will have a sleeping car and more than one restroom.  If you need to walk while the train is moving, hold on to seats and handrails.  Move your arms and legs when seated.  Do not travel for more than 5-6 hours a day. What do I need to know about traveling by airplane?  Before booking your trip, ask about the airline's rules about pregnancy. Pregnant women may be restricted from flying after a certain time of the pregnancy. Every airline has its own rules.  Make sure you complete your trip before 36 weeks of pregnancy.  Ask whether the airplane cabin will be pressurized. Do not board an unpressurized plane that will fly above 7,000 ft (2,100 m).  Try to get a bulkhead or an aisle seat so it is easier to get up, stretch, and use the bathroom.  Wear layers since the cabin temperature can change.  Put all your medicines and medical records in your carry-on bag.  Avoid drinking caffeinated or carbonated beverages.  Avoid eating foods that may make you bloated.  Do not eat a big meal.  If you need to walk through the airplane, hold on to the seats and handrails.  Move your arms and legs when seated.  Wear your seat belt. What do I need to know about traveling by cruise ship?  Before booking your trip, ask the cruise ship company: ? Are pregnant women allowed on the ship? ? Is there a medical facility and doctor on board? ? Does the ship dock in places where there are doctors and medical facilities?  Before booking your trip, ask your doctor: ? Is it safe to take medicines if I get seasick? ? Is it  safe to wear acupressure wristbands to prevent seasickness? If the answer is yes, consider buying  one. Contact a health care provider if:  You have diarrhea.  You vomit.  You have nausea or seasickness. Get help right away if:  You have vaginal bleeding.  You have severe vomiting or diarrhea.  You have pelvic or abdominal pain.  You have contractions.  Your water breaks.  You have a persistent headache.  Your eyesight changes or you see spots.  Your face or hands are swollen.  You have pain, warmth, or swelling in your legs or ankles. Summary  Most pregnant women can safely travel until the last month of their pregnancy. Your doctor may tell you to limit or avoid travel depending on how far you are in your pregnancy and if you have any medical or pregnancy problems.  The best time to travel is between 14 and 28 weeks of your pregnancy.  Before you go on your trip, make sure you discuss your trip with your health care provider, get a copy of your medical records, and try to get information on medical centers and doctors at your destination.  While on your trip, make sure you wear comfortable clothes and shoes, eat a healthy diet, drink plenty of fluids, take your vitamins and supplements, take breaks and rest often, and wear your seat belt.  Before booking a flight, ask about the airline's rules about pregnancy. Every airline has its own rules. Do the same for a train, bus, or cruise ship. This information is not intended to replace advice given to you by your health care provider. Make sure you discuss any questions you have with your health care provider. Document Revised: 04/18/2019 Document Reviewed: 12/08/2016 Elsevier Patient Education  2020 [employer].

## 2020-08-22 NOTE — Progress Notes (Signed)
Routine Prenatal Care Visit  Subjective  Destiny Allen is a 34 y.o. [id number] at [redacted]w[redacted]d being seen today for ongoing prenatal care.  She is currently monitored for the following issues for this high-risk pregnancy and has History of ectopic pregnancy; Partial molar pregnancy; Iron deficiency anemia; Mixed hyperlipidemia; Vitamin D deficiency; Impaired fasting glucose; Supervision of high risk pregnancy, antepartum; History of cesarean delivery affecting pregnancy; Antepartum multigravida of advanced maternal age; Positive GBS test; Cyst of left ovary; Fatigue; and LLQ pain on their problem list.  ----------------------------------------------------------------------------------- Patient reports feels baby move each day, but not as much as other pregnancies.   Contractions: Not present. Vag. Bleeding: None.  Movement: Present. Denies leaking of fluid.  ----------------------------------------------------------------------------------- The following portions of the patient's history were reviewed and updated as appropriate: allergies, current medications, past family history, past medical history, past social history, past surgical history and problem list. Problem list updated.   Objective  Blood pressure 104/70, height 4\' 11"  (1.499 m), weight 149 lb 9.6 oz (67.9 kg), last menstrual period 03/21/2020, unknown if currently breastfeeding. Pregravid weight 133 lb (60.3 kg) Total Weight Gain 16 lb 9.6 oz (7.53 kg) Urinalysis:      Fetal Status: Fetal Heart Rate (bpm): 154 Fundal Height: 24 cm Movement: Present     General:  Alert, oriented and cooperative. Patient is in no acute distress.  Skin: Skin is warm and dry. No rash noted.   Cardiovascular: Normal heart rate noted  Respiratory: Normal respiratory effort, no problems with respiration noted  Abdomen: Soft, gravid, appropriate for gestational age. Pain/Pressure: Absent     Pelvic:  Cervical exam deferred        Extremities: Normal range  of motion.  Edema: None  Mental Status: Normal mood and affect. Normal behavior. Normal judgment and thought content.     Assessment   34 y.o. 31 at [redacted]w[redacted]d by  12/26/2020, by Last Menstrual Period presenting for routine prenatal visit  Plan   SIXTH Problems (from 05/15/20 to present)    Problem Noted Resolved   Supervision of high risk pregnancy, antepartum 02/21/2019 by 04/23/2019, MD No   Overview Addendum 06/10/2020  8:28 AM by 06/12/2020, CNM      [facility] Prenatal Labs  Dating  5 wk Mirna Mires Blood type: A/Positive/-- (04/07 1636)   Genetic Screen Declined . inheritest negative   Antibody:Negative (04/07 1636)  Anatomic 10-04-1998 Normal Rubella: 24.50 (04/07 1636)  Varicella: Immune  GTT Early: 71  28 wk:   173; 3 hour GTT: 87, 179, 161, 134   RPR: Non Reactive (04/07 1636)   Rhogam  not needed HBsAg: Negative (04/07 1636)   TDaP vaccine 07/28/19               HIV: Non Reactive (04/07 1636)   Flu Shot                                GBS:   Contraception Natural family planning Pap: NIL 2018  CBB     CS/VBAC  Hx of cesarean section 11/13   Baby Food Breast and bottle   Support Person  Renee           Previous Version      Discussed travel during pregnancy Patient geeing flu shot at work She has received covid vaccination  Gestational age appropriate obstetric precautions including but not limited to vaginal bleeding, contractions, leaking  of fluid and fetal movement were reviewed in detail with the patient.    Return in about 2 weeks (around 09/05/2020) for ROB in person.  Natale Milch MD [facility], [facility] 08/22/2020, 9:35 AM

## 2020-08-22 NOTE — Progress Notes (Signed)
Pt states her baby is not moving as much as before.

## 2020-09-06 ENCOUNTER — Ambulatory Visit (INDEPENDENT_AMBULATORY_CARE_PROVIDER_SITE_OTHER): Payer: 59 | Admitting: Obstetrics

## 2020-09-06 ENCOUNTER — Other Ambulatory Visit: Payer: Self-pay

## 2020-09-06 VITALS — BP 100/60 | Wt 151.0 lb

## 2020-09-06 DIAGNOSIS — O0990 Supervision of high risk pregnancy, unspecified, unspecified trimester: Secondary | ICD-10-CM

## 2020-09-06 DIAGNOSIS — Z3A24 24 weeks gestation of pregnancy: Secondary | ICD-10-CM

## 2020-09-06 LAB — POCT URINALYSIS DIPSTICK OB
Glucose, UA: NEGATIVE
POC,PROTEIN,UA: NEGATIVE

## 2020-09-06 NOTE — Progress Notes (Signed)
ROB- no concerns 

## 2020-09-06 NOTE — Progress Notes (Signed)
Routine Prenatal Care Visit  Subjective  Destiny Allen is a 34 y.o. [id number] at [redacted]w[redacted]d being seen today for ongoing prenatal care.  She is currently monitored for the following issues for this high-risk pregnancy and has History of ectopic pregnancy; Partial molar pregnancy; Iron deficiency anemia; Mixed hyperlipidemia; Vitamin D deficiency; Impaired fasting glucose; Supervision of high risk pregnancy, antepartum; History of cesarean delivery affecting pregnancy; Antepartum multigravida of advanced maternal age; Positive GBS test; Cyst of left ovary; Fatigue; and LLQ pain on their problem list.  ----------------------------------------------------------------------------------- Patient reports no complaints.  She does share that she doesn't have the same active fetal movement with this baby that she had with her sons. Is reassured by the [facility] today.  .  .   [name] Fluid denies.  ----------------------------------------------------------------------------------- The following portions of the patient's history were reviewed and updated as appropriate: allergies, current medications, past family history, past medical history, past social history, past surgical history and problem list. Problem list updated.  Objective  Blood pressure 100/60, weight 151 lb (68.5 kg), last menstrual period 03/21/2020, unknown if currently breastfeeding. Pregravid weight 133 lb (60.3 kg) Total Weight Gain 18 lb (8.165 kg) Urinalysis: Urine Protein Negative  Urine Glucose Negative  Fetal Status:           General:  Alert, oriented and cooperative. Patient is in no acute distress.  Skin: Skin is warm and dry. No rash noted.   Cardiovascular: Normal heart rate noted  Respiratory: Normal respiratory effort, no problems with respiration noted  Abdomen: Soft, gravid, appropriate for gestational age.       Pelvic:  Cervical exam deferred        Extremities: Normal range of motion.     Mental Status: Normal mood and  affect. Normal behavior. Normal judgment and thought content.   Assessment   34 y.o. [id number] at [redacted]w[redacted]d by  12/26/2020, by Last Menstrual Period presenting for routine prenatal visit  Plan   SIXTH Problems (from 05/15/20 to present)    Problem Noted Resolved   Supervision of high risk pregnancy, antepartum 02/21/2019 by Natale Milch, MD No   Overview Addendum 06/10/2020  8:28 AM by Mirna Mires, CNM      [facility] Prenatal Labs  Dating  5 wk [place] Blood type: A/Positive/-- (04/07 1636)   Genetic Screen Declined . inheritest negative   Antibody:Negative (04/07 1636)  Anatomic [place] Normal Rubella: 24.50 (04/07 1636)  Varicella: Immune  GTT Early: 71  28 wk:   173; 3 hour GTT: 87, 179, 161, 134   RPR: Non Reactive (04/07 1636)   Rhogam  not needed HBsAg: Negative (04/07 1636)   TDaP vaccine 07/28/19               HIV: Non Reactive (04/07 1636)   Flu Shot                                GBS:   Contraception Natural family planning Pap: NIL 2018  CBB     CS/VBAC  Hx of cesarean section 11/13   Baby Food Breast and bottle   Support Person  Renee           Previous Version       Preterm labor symptoms and general obstetric precautions including but not limited to vaginal bleeding, contractions, leaking of fluid and fetal movement were reviewed in detail with the patient. Please refer to After  Visit Summary for other counseling recommendations.   Return in about 2 weeks (around 09/20/2020) for return OB.  Mirna Mires, CNM  09/06/2020 5:09 PM

## 2020-10-04 ENCOUNTER — Other Ambulatory Visit: Payer: 59

## 2020-10-04 ENCOUNTER — Ambulatory Visit (INDEPENDENT_AMBULATORY_CARE_PROVIDER_SITE_OTHER): Payer: 59 | Admitting: Obstetrics

## 2020-10-04 ENCOUNTER — Other Ambulatory Visit: Payer: Self-pay

## 2020-10-04 VITALS — BP 110/70 | Wt 158.0 lb

## 2020-10-04 DIAGNOSIS — Z3A24 24 weeks gestation of pregnancy: Secondary | ICD-10-CM

## 2020-10-04 DIAGNOSIS — O0990 Supervision of high risk pregnancy, unspecified, unspecified trimester: Secondary | ICD-10-CM

## 2020-10-04 DIAGNOSIS — Z3A28 28 weeks gestation of pregnancy: Secondary | ICD-10-CM

## 2020-10-04 LAB — POCT URINALYSIS DIPSTICK OB
Glucose, UA: NEGATIVE
POC,PROTEIN,UA: NEGATIVE

## 2020-10-04 NOTE — Progress Notes (Signed)
Routine Prenatal Care Visit  Subjective  Destiny Allen is a 34 y.o. D6Q2297 at [redacted]w[redacted]d being seen today for ongoing prenatal care.  She is currently monitored for the following issues for this high-risk pregnancy and has History of ectopic pregnancy; Partial molar pregnancy; Iron deficiency anemia; Mixed hyperlipidemia; Vitamin D deficiency; Impaired fasting glucose; Supervision of high risk pregnancy, antepartum; History of cesarean delivery affecting pregnancy; Antepartum multigravida of advanced maternal age; Positive GBS test; Cyst of left ovary; Fatigue; and LLQ pain on their problem list.  ----------------------------------------------------------------------------------- Patient reports no complaints.    .  .   [name] Fluid denies.  ----------------------------------------------------------------------------------- The following portions of the patient's history were reviewed and updated as appropriate: allergies, current medications, past family history, past medical history, past social history, past surgical history and problem list. Problem list updated.  Objective  Blood pressure 110/70, weight 158 lb (71.7 kg), last menstrual period 03/21/2020, unknown if currently breastfeeding. Pregravid weight 133 lb (60.3 kg) Total Weight Gain 25 lb (11.3 kg) Urinalysis: Urine Protein Negative  Urine Glucose Negative  Fetal Status:           General:  Alert, oriented and cooperative. Patient is in no acute distress.  Skin: Skin is warm and dry. No rash noted.   Cardiovascular: Normal heart rate noted  Respiratory: Normal respiratory effort, no problems with respiration noted  Abdomen: Soft, gravid, appropriate for gestational age.       Pelvic:  Cervical exam deferred        Extremities: Normal range of motion.     Mental Status: Normal mood and affect. Normal behavior. Normal judgment and thought content.   Assessment   34 y.o. L8X2119 at [redacted]w[redacted]d by  12/26/2020, by Last Menstrual  Period presenting for routine prenatal visit  Plan   SIXTH Problems (from 05/15/20 to present)    Problem Noted Resolved   Supervision of high risk pregnancy, antepartum 02/21/2019 by Natale Milch, MD No   Overview Addendum 06/10/2020  8:28 AM by Mirna Mires, CNM      [facility] Prenatal Labs  Dating  5 wk [place] Blood type: A/Positive/-- (04/07 1636)   Genetic Screen Declined . inheritest negative   Antibody:Negative (04/07 1636)  Anatomic [place] Normal Rubella: 24.50 (04/07 1636)  Varicella: Immune  GTT Early: 71  28 wk:   173; 3 hour GTT: 87, 179, 161, 134   RPR: Non Reactive (04/07 1636)   Rhogam  not needed HBsAg: Negative (04/07 1636)   TDaP vaccine 07/28/19               HIV: Non Reactive (04/07 1636)   Flu Shot                                GBS:   Contraception Natural family planning Pap: NIL 2018  CBB     CS/VBAC  Hx of cesarean section 11/13   Baby Food Breast and bottle   Support Person  Renee           Previous Version       Preterm labor symptoms and general obstetric precautions including but not limited to vaginal bleeding, contractions, leaking of fluid and fetal movement were reviewed in detail with the patient. Please refer to After Visit Summary for other counseling recommendations.  28 week labs drawn today.  No follow-ups on file.  Mirna Mires, CNM  10/04/2020 3:15 PM

## 2020-10-04 NOTE — Progress Notes (Signed)
No concerns.rj 

## 2020-10-05 LAB — 28 WEEK RH+PANEL
Basophils Absolute: 0 10*3/uL (ref 0.0–0.2)
Basos: 0 %
EOS (ABSOLUTE): 0.1 10*3/uL (ref 0.0–0.4)
Eos: 1 %
Gestational Diabetes Screen: 129 mg/dL (ref 65–139)
HIV Screen 4th Generation wRfx: NONREACTIVE
Hematocrit: 35 % (ref 34.0–46.6)
Hemoglobin: 11.8 g/dL (ref 11.1–15.9)
Immature Grans (Abs): 0 10*3/uL (ref 0.0–0.1)
Immature Granulocytes: 0 %
Lymphocytes Absolute: 1.8 10*3/uL (ref 0.7–3.1)
Lymphs: 23 %
MCH: 30.3 pg (ref 26.6–33.0)
MCHC: 33.7 g/dL (ref 31.5–35.7)
MCV: 90 fL (ref 79–97)
Monocytes Absolute: 0.2 10*3/uL (ref 0.1–0.9)
Monocytes: 3 %
Neutrophils Absolute: 5.7 10*3/uL (ref 1.4–7.0)
Neutrophils: 73 %
Platelets: 213 10*3/uL (ref 150–450)
RBC: 3.9 x10E6/uL (ref 3.77–5.28)
RDW: 12.9 % (ref 11.7–15.4)
RPR Ser Ql: NONREACTIVE
WBC: 7.8 10*3/uL (ref 3.4–10.8)

## 2020-10-07 ENCOUNTER — Encounter: Payer: 59 | Admitting: Obstetrics

## 2020-10-07 ENCOUNTER — Other Ambulatory Visit: Payer: 59

## 2020-10-21 ENCOUNTER — Ambulatory Visit (INDEPENDENT_AMBULATORY_CARE_PROVIDER_SITE_OTHER): Payer: 59 | Admitting: Obstetrics

## 2020-10-21 ENCOUNTER — Other Ambulatory Visit: Payer: Self-pay

## 2020-10-21 VITALS — BP 100/60 | Wt 161.0 lb

## 2020-10-21 DIAGNOSIS — O0990 Supervision of high risk pregnancy, unspecified, unspecified trimester: Secondary | ICD-10-CM

## 2020-10-21 DIAGNOSIS — Z3A30 30 weeks gestation of pregnancy: Secondary | ICD-10-CM

## 2020-10-21 LAB — POCT URINALYSIS DIPSTICK OB
Glucose, UA: NEGATIVE
POC,PROTEIN,UA: NEGATIVE

## 2020-10-21 NOTE — Addendum Note (Signed)
Addended by: Cornelius Moras D on: 10/21/2020 04:51 PM   Modules accepted: Orders

## 2020-10-21 NOTE — Progress Notes (Signed)
Routine Prenatal Care Visit  Subjective  Destiny Allen is a 34 y.o. K7Q2595 at [redacted]w[redacted]d being seen today for ongoing prenatal care.  She is currently monitored for the following issues for this high-risk pregnancy and has History of ectopic pregnancy; Partial molar pregnancy; Iron deficiency anemia; Mixed hyperlipidemia; Vitamin D deficiency; Impaired fasting glucose; Supervision of high risk pregnancy, antepartum; History of cesarean delivery affecting pregnancy; Antepartum multigravida of advanced maternal age; Positive GBS test; Cyst of left ovary; Fatigue; and LLQ pain on their problem list.  ----------------------------------------------------------------------------------- Patient reports no complaints.   Contractions: Not present. Vag. Bleeding: None.  Movement: Present. Leaking Fluid denies.  ----------------------------------------------------------------------------------- The following portions of the patient's history were reviewed and updated as appropriate: allergies, current medications, past family history, past medical history, past social history, past surgical history and problem list. Problem list updated.  Objective  Blood pressure 100/60, weight 161 lb (73 kg), last menstrual period 03/21/2020, unknown if currently breastfeeding. Pregravid weight 133 lb (60.3 kg) Total Weight Gain 28 lb (12.7 kg) Urinalysis: Urine Protein    Urine Glucose    Fetal Status:     Movement: Present     General:  Alert, oriented and cooperative. Patient is in no acute distress.  Skin: Skin is warm and dry. No rash noted.   Cardiovascular: Normal heart rate noted  Respiratory: Normal respiratory effort, no problems with respiration noted  Abdomen: Soft, gravid, appropriate for gestational age. Pain/Pressure: Absent     Pelvic:  Cervical exam deferred        Extremities: Normal range of motion.     Mental Status: Normal mood and affect. Normal behavior. Normal judgment and thought content.    Assessment   34 y.o. G3O7564 at [redacted]w[redacted]d by  12/26/2020, by Last Menstrual Period presenting for routine prenatal visit  Plan   SIXTH Problems (from 05/15/20 to present)    Problem Noted Resolved   Supervision of high risk pregnancy, antepartum 02/21/2019 by Natale Milch, MD No   Overview Addendum 10/21/2020  4:12 PM by Mirna Mires, CNM      [facility] Prenatal Labs  Dating  5 wk [place] Blood type: A/Positive/-- (04/07 1636)   Genetic Screen Declined . inheritest negative   Antibody:Negative (04/07 1636)  Anatomic [place] Normal Rubella: 24.50 (04/07 1636)  Varicella: Immune  GTT Early: 71  28 wk GTT:129   RPR: Non Reactive (04/07 1636)   Rhogam  not needed HBsAg: Negative (04/07 1636)   TDaP vaccine 07/28/19               HIV: Non Reactive (04/07 1636)   Flu Shot                declines                GBS:   Contraception Natural family planning Pap: NIL 2018  CBB     CS/VBAC  Hx of cesarean section 11/13   Baby Food Breast and bottle   Support Person  Renee           Previous Version       Preterm labor symptoms and general obstetric precautions including but not limited to vaginal bleeding, contractions, leaking of fluid and fetal movement were reviewed in detail with the patient. Please refer to After Visit Summary for other counseling recommendations. FHTs in the 160s to 170. Placed on the monitor for a few minutes.   Return in about 2 weeks (around 11/04/2020) for return  Willene Hatchet, CNM  10/21/2020 4:12 PM

## 2020-11-04 ENCOUNTER — Ambulatory Visit (INDEPENDENT_AMBULATORY_CARE_PROVIDER_SITE_OTHER): Payer: 59 | Admitting: Obstetrics

## 2020-11-04 ENCOUNTER — Other Ambulatory Visit: Payer: Self-pay

## 2020-11-04 VITALS — BP 120/70 | Wt 165.0 lb

## 2020-11-04 DIAGNOSIS — O26849 Uterine size-date discrepancy, unspecified trimester: Secondary | ICD-10-CM

## 2020-11-04 DIAGNOSIS — Z3A34 34 weeks gestation of pregnancy: Secondary | ICD-10-CM

## 2020-11-04 DIAGNOSIS — O0990 Supervision of high risk pregnancy, unspecified, unspecified trimester: Secondary | ICD-10-CM

## 2020-11-04 DIAGNOSIS — Z3A32 32 weeks gestation of pregnancy: Secondary | ICD-10-CM

## 2020-11-04 LAB — POCT URINALYSIS DIPSTICK OB
Glucose, UA: NEGATIVE
POC,PROTEIN,UA: NEGATIVE

## 2020-11-04 NOTE — Progress Notes (Signed)
No concerns.rj 

## 2020-11-04 NOTE — Progress Notes (Signed)
Routine Prenatal Care Visit  Subjective  Destiny Allen is a 34 y.o. D7A1287 at [redacted]w[redacted]d being seen today for ongoing prenatal care.  She is currently monitored for the following issues for this high-risk pregnancy and has History of ectopic pregnancy; Partial molar pregnancy; Iron deficiency anemia; Mixed hyperlipidemia; Vitamin D deficiency; Impaired fasting glucose; Supervision of high risk pregnancy, antepartum; History of cesarean delivery affecting pregnancy; Antepartum multigravida of advanced maternal age; Positive GBS test; Cyst of left ovary; Fatigue; and LLQ pain on their problem list.  ----------------------------------------------------------------------------------- Patient reports no complaints.    .  .   [name] Fluid denies.  ----------------------------------------------------------------------------------- The following portions of the patient's history were reviewed and updated as appropriate: allergies, current medications, past family history, past medical history, past social history, past surgical history and problem list. Problem list updated.  Objective  Blood pressure 120/70, weight 165 lb (74.8 kg), last menstrual period 03/21/2020, unknown if currently breastfeeding. Pregravid weight 133 lb (60.3 kg) Total Weight Gain 32 lb (14.5 kg) Urinalysis: Urine Protein Negative  Urine Glucose Negative  Fetal Status:           General:  Alert, oriented and cooperative. Patient is in no acute distress.  Skin: Skin is warm and dry. No rash noted.   Cardiovascular: Normal heart rate noted  Respiratory: Normal respiratory effort, no problems with respiration noted  Abdomen: Soft, gravid, appropriate for gestational age.       Pelvic:  Cervical exam deferred        Extremities: Normal range of motion.     Mental Status: Normal mood and affect. Normal behavior. Normal judgment and thought content.   Assessment   34 y.o. O6V6720 at [redacted]w[redacted]d by  12/26/2020, by Last Menstrual  Period presenting for routine prenatal visit  Plan   SIXTH Problems (from 05/15/20 to present)    Problem Noted Resolved   Supervision of high risk pregnancy, antepartum 02/21/2019 by Natale Milch, MD No   Overview Addendum 10/21/2020  4:12 PM by Mirna Mires, CNM      [facility] Prenatal Labs  Dating  5 wk [place] Blood type: A/Positive/-- (04/07 1636)   Genetic Screen Declined . inheritest negative   Antibody:Negative (04/07 1636)  Anatomic [place] Normal Rubella: 24.50 (04/07 1636)  Varicella: Immune  GTT Early: 71  28 wk GTT:129   RPR: Non Reactive (04/07 1636)   Rhogam  not needed HBsAg: Negative (04/07 1636)   TDaP vaccine 07/28/19               HIV: Non Reactive (04/07 1636)   Flu Shot                declines                GBS:   Contraception Natural family planning Pap: NIL 2018  CBB     CS/VBAC  Hx of cesarean section 11/13   Baby Food Breast and bottle   Support Person  Renee           Previous Version       Preterm labor symptoms and general obstetric precautions including but not limited to vaginal bleeding, contractions, leaking of fluid and fetal movement were reviewed in detail with the patient. Please refer to After Visit Summary for other counseling recommendations.  She measures large for dates today. Will have growth and AFI next visit. Encouraged to see [facility] as she wants him to do her repeat CS Return in  about 2 weeks (around 11/18/2020) for return OB and scan for growth., AFI.  Mirna Mires, CNM  11/04/2020 4:41 PM

## 2020-11-06 ENCOUNTER — Telehealth: Payer: Self-pay | Admitting: Obstetrics and Gynecology

## 2020-11-06 NOTE — Telephone Encounter (Signed)
Called and left message for patient to call back to make FMLA paperwork payment via phone.

## 2020-11-14 ENCOUNTER — Ambulatory Visit (INDEPENDENT_AMBULATORY_CARE_PROVIDER_SITE_OTHER): Payer: 59 | Admitting: Obstetrics & Gynecology

## 2020-11-14 ENCOUNTER — Encounter: Payer: Self-pay | Admitting: Obstetrics & Gynecology

## 2020-11-14 ENCOUNTER — Telehealth: Payer: Self-pay

## 2020-11-14 ENCOUNTER — Other Ambulatory Visit: Payer: Self-pay

## 2020-11-14 VITALS — BP 100/60 | Wt 166.0 lb

## 2020-11-14 DIAGNOSIS — R1030 Lower abdominal pain, unspecified: Secondary | ICD-10-CM

## 2020-11-14 DIAGNOSIS — O26899 Other specified pregnancy related conditions, unspecified trimester: Secondary | ICD-10-CM | POA: Diagnosis not present

## 2020-11-14 LAB — POCT URINALYSIS DIPSTICK OB
Glucose, UA: NEGATIVE
POC,PROTEIN,UA: NEGATIVE

## 2020-11-14 LAB — POCT URINALYSIS DIPSTICK
Bilirubin, UA: NEGATIVE
Blood, UA: NEGATIVE
Glucose, UA: NEGATIVE
Ketones, UA: NEGATIVE
Leukocytes, UA: NEGATIVE
Nitrite, UA: NEGATIVE
Protein, UA: NEGATIVE
Spec Grav, UA: 1.01 (ref 1.010–1.025)
Urobilinogen, UA: 0.2 E.U./dL
pH, UA: 5 (ref 5.0–8.0)

## 2020-11-14 NOTE — Telephone Encounter (Signed)
Pt calling; has a lot of pressure in lower abd.  [phone]

## 2020-11-14 NOTE — Telephone Encounter (Signed)
Pt states is having a lot of pressure on lower belly; no ctxs, no pain voiding, GFM, no leaking of fluid.  Adv will ask providers and get back with her.

## 2020-11-14 NOTE — Addendum Note (Signed)
Addended by: Cornelius Moras D on: 11/14/2020 03:22 PM   Modules accepted: Orders

## 2020-11-14 NOTE — Progress Notes (Signed)
Gynecology Pelvic Pain Evaluation   Chief Complaint: Abd Pain  History of Present Illness:   Patient is a 34 y.o. Q4O9629 who LMP was Patient's last menstrual period was 03/21/2020 (exact date)., presents today for a problem visit.  She complains of pain.   Her pain is localized to the suprapubic and lower L and R quadrant area, described as constant and aching, began today and its severity is described as severe.  She is [redacted] weeks pregnant and feels differnet in that the baby has either dropped or the pain is just more lower than all over abdomen as before. The pain radiates to the  Non-radiating, She does have low back pain. She has these associated symptoms which include fetal movements; denies VB or ROM, No Fever or Dysuria. Patient has these modifiers which include relaxation that make it better and activity that make it worse.  Context includes: pregnant [facility] 12/28/2020).  PMHx: She  has a past medical history of Infertility associated with anovulation, Ovarian cyst, Postpartum care following cesarean delivery (08/30/2019), and Preterm labor (08/28/2019). Also,  has a past surgical history that includes Cesarean section (2011); Dilation and evacuation (N/A, 11/12/2017); and Cesarean section (N/A, 08/28/2019)., family history includes Hypertension in her father.,  reports that she has never smoked. She has never used smokeless tobacco. She reports that she does not drink alcohol and does not use drugs.  She has a current medication list which includes the following prescription(s): calcium acetate (phos binder), ferrous sulfate, fish oil, prenatal, and vitamin c. Also, has No Known Allergies.  Review of Systems  Constitutional: Positive for malaise/fatigue. Negative for chills and fever.  HENT: Negative for congestion, sinus pain and sore throat.   Eyes: Negative for blurred vision and pain.  Respiratory: Negative for cough and wheezing.   Cardiovascular: Negative for chest pain and leg  swelling.  Gastrointestinal: Positive for abdominal pain. Negative for constipation, diarrhea, heartburn, nausea and vomiting.  Genitourinary: Negative for dysuria, frequency, hematuria and urgency.  Musculoskeletal: Negative for back pain, joint pain, myalgias and neck pain.  Skin: Negative for itching and rash.  Neurological: Negative for dizziness, tremors and weakness.  Endo/Heme/Allergies: Does not bruise/bleed easily.  Psychiatric/Behavioral: Negative for depression. The patient is not nervous/anxious and does not have insomnia.     Objective: BP 100/60   Wt 166 lb (75.3 kg)   LMP 03/21/2020 (Exact Date)   BMI 33.53 kg/m  Physical Exam Constitutional:      General: She is not in acute distress.    Appearance: She is well-developed.  Genitourinary:     Vagina and uterus normal.     No vaginal erythema or bleeding.      Right Adnexa: not tender and no mass present.    Left Adnexa: not tender and no mass present.    No cervical motion tenderness, discharge, polyp or nabothian cyst.     Uterus is mobile.     Uterus is not enlarged.     No uterine mass detected.    Uterus is midaxial.     Pelvic exam was performed with patient supine.  HENT:     Head: Normocephalic and atraumatic.     Nose: Nose normal.  Abdominal:     General: There is no distension.     Palpations: There is no mass.     Tenderness: There is abdominal tenderness in the right lower quadrant, suprapubic area and left lower quadrant.     Hernia: No hernia is present.  Comments: Gravid, FH 36 cm FHT 130s  Musculoskeletal:        General: Normal range of motion.  Neurological:     Mental Status: She is alert and oriented to person, place, and time.     Cranial Nerves: No cranial nerve deficit.  Skin:    General: Skin is warm and dry.  Psychiatric:        Attention and Perception: Attention normal.        Mood and Affect: Mood and affect normal.        Speech: Speech normal.        Behavior:  Behavior normal.        Thought Content: Thought content normal.        Judgment: Judgment normal.     Female chaperone present for pelvic portion of the physical exam  UA NEG  Assessment: 34 y.o. F7P1025, also [redacted] weeks pregnant New Onset Diagnosis: 1. Pregnancy related abdominal pain of lower quadrant, antepartum - Vertex presentation, not dilated; low likelihood for PTL - Fetal fibronectin - Rest, Heat, Tylenol advised - Prior CS, plans repeat To L&D if sx's worsen   Annamarie Major, MD, [facility], [facility] 11/14/2020  3:09 PM

## 2020-11-14 NOTE — Telephone Encounter (Signed)
Per PH pt aware to come to office to be seen.

## 2020-11-15 LAB — FETAL FIBRONECTIN: Fetal Fibronectin: NEGATIVE

## 2020-11-16 ENCOUNTER — Encounter: Payer: Self-pay | Admitting: Obstetrics and Gynecology

## 2020-11-16 ENCOUNTER — Other Ambulatory Visit: Payer: Self-pay

## 2020-11-16 ENCOUNTER — Inpatient Hospital Stay
Admission: EM | Admit: 2020-11-16 | Discharge: 2020-11-19 | DRG: 787 | Disposition: A | Discharge status: Home / Self Care | Payer: 59 | Attending: Obstetrics and Gynecology | Admitting: Obstetrics and Gynecology

## 2020-11-16 ENCOUNTER — Encounter
Admission: EM | Disposition: A | Discharge status: Home / Self Care | Payer: Self-pay | Source: Home / Self Care | Attending: Obstetrics and Gynecology

## 2020-11-16 ENCOUNTER — Inpatient Hospital Stay: Payer: 59 | Admitting: Anesthesiology

## 2020-11-16 DIAGNOSIS — O9081 Anemia of the puerperium: Secondary | ICD-10-CM | POA: Diagnosis not present

## 2020-11-16 DIAGNOSIS — Z3A34 34 weeks gestation of pregnancy: Secondary | ICD-10-CM

## 2020-11-16 DIAGNOSIS — D62 Acute posthemorrhagic anemia: Secondary | ICD-10-CM | POA: Diagnosis not present

## 2020-11-16 DIAGNOSIS — Z98891 History of uterine scar from previous surgery: Secondary | ICD-10-CM

## 2020-11-16 DIAGNOSIS — Z20822 Contact with and (suspected) exposure to covid-19: Secondary | ICD-10-CM | POA: Diagnosis present

## 2020-11-16 DIAGNOSIS — O34211 Maternal care for low transverse scar from previous cesarean delivery: Secondary | ICD-10-CM | POA: Diagnosis not present

## 2020-11-16 DIAGNOSIS — O0990 Supervision of high risk pregnancy, unspecified, unspecified trimester: Principal | ICD-10-CM

## 2020-11-16 DIAGNOSIS — O34219 Maternal care for unspecified type scar from previous cesarean delivery: Secondary | ICD-10-CM | POA: Diagnosis present

## 2020-11-16 DIAGNOSIS — O321XX0 Maternal care for breech presentation, not applicable or unspecified: Secondary | ICD-10-CM | POA: Diagnosis present

## 2020-11-16 DIAGNOSIS — R109 Unspecified abdominal pain: Secondary | ICD-10-CM | POA: Diagnosis not present

## 2020-11-16 DIAGNOSIS — R1030 Lower abdominal pain, unspecified: Secondary | ICD-10-CM | POA: Diagnosis present

## 2020-11-16 DIAGNOSIS — O26893 Other specified pregnancy related conditions, third trimester: Secondary | ICD-10-CM | POA: Diagnosis present

## 2020-11-16 LAB — URINALYSIS, COMPLETE (UACMP) WITH MICROSCOPIC
Bilirubin Urine: NEGATIVE
Glucose, UA: 50 mg/dL — AB
Hgb urine dipstick: NEGATIVE
Ketones, ur: NEGATIVE mg/dL
Leukocytes,Ua: NEGATIVE
Nitrite: NEGATIVE
Protein, ur: NEGATIVE mg/dL
Specific Gravity, Urine: 1.021 (ref 1.005–1.030)
pH: 6 (ref 5.0–8.0)

## 2020-11-16 LAB — CBC
HCT: 36.1 % (ref 36.0–46.0)
Hemoglobin: 12 g/dL (ref 12.0–15.0)
MCH: 30.5 pg (ref 26.0–34.0)
MCHC: 33.2 g/dL (ref 30.0–36.0)
MCV: 91.9 fL (ref 80.0–100.0)
Platelets: 191 10*3/uL (ref 150–400)
RBC: 3.93 MIL/uL (ref 3.87–5.11)
RDW: 13.8 % (ref 11.5–15.5)
WBC: 9.1 10*3/uL (ref 4.0–10.5)
nRBC: 0 % (ref 0.0–0.2)

## 2020-11-16 LAB — COMPREHENSIVE METABOLIC PANEL
ALT: 12 U/L (ref 0–44)
AST: 18 U/L (ref 15–41)
Albumin: 3.3 g/dL — ABNORMAL LOW (ref 3.5–5.0)
Alkaline Phosphatase: 60 U/L (ref 38–126)
Anion gap: 12 (ref 5–15)
BUN: 13 mg/dL (ref 6–20)
CO2: 20 mmol/L — ABNORMAL LOW (ref 22–32)
Calcium: 9.4 mg/dL (ref 8.9–10.3)
Chloride: 105 mmol/L (ref 98–111)
Creatinine, Ser: 0.49 mg/dL (ref 0.44–1.00)
GFR, Estimated: >60 mL/min (ref >60)
Glucose, Bld: 118 mg/dL — ABNORMAL HIGH (ref 70–99)
Potassium: 3.4 mmol/L — ABNORMAL LOW (ref 3.5–5.1)
Sodium: 137 mmol/L (ref 135–145)
Total Bilirubin: 0.4 mg/dL (ref 0.3–1.2)
Total Protein: 6.7 g/dL (ref 6.5–8.1)

## 2020-11-16 LAB — SAMPLE TO BLOOD BANK

## 2020-11-16 LAB — RAPID HIV SCREEN (HIV 1/2 AB+AG)
HIV 1/2 Antibodies: NONREACTIVE
HIV-1 P24 Antigen - HIV24: NONREACTIVE

## 2020-11-16 LAB — RESP PANEL BY RT-PCR (FLU A&B, COVID) ARPGX2
Influenza A by PCR: NEGATIVE
Influenza B by PCR: NEGATIVE
SARS Coronavirus 2 by RT PCR: NEGATIVE

## 2020-11-16 LAB — LIPASE, BLOOD: Lipase: 21 U/L (ref 11–51)

## 2020-11-16 SURGERY — Surgical Case
Anesthesia: Spinal

## 2020-11-16 MED ORDER — PROPOFOL 10 MG/ML IV BOLUS
INTRAVENOUS | Status: DC | PRN
  Administered 2020-11-16: 150 mg via INTRAVENOUS

## 2020-11-16 MED ORDER — FENTANYL CITRATE (PF) 100 MCG/2ML IJ SOLN
INTRAMUSCULAR | Status: AC
  Filled 2020-11-16: qty 2

## 2020-11-16 MED ORDER — FENTANYL CITRATE (PF) 100 MCG/2ML IJ SOLN
INTRAMUSCULAR | Status: DC | PRN
  Administered 2020-11-16: 25 ug via INTRAVENOUS
  Administered 2020-11-16: 50 ug via INTRAVENOUS
  Administered 2020-11-16: 25 ug via INTRAVENOUS

## 2020-11-16 MED ORDER — CEFAZOLIN SODIUM-DEXTROSE 2-4 GM/100ML-% IV SOLN
2.0000 g | INTRAVENOUS | Status: AC
  Administered 2020-11-16: 2 g via INTRAVENOUS
  Filled 2020-11-16: qty 100

## 2020-11-16 MED ORDER — SODIUM CHLORIDE 0.9% IV SOLUTION: Freq: Once | INTRAVENOUS | Status: DC

## 2020-11-16 MED ORDER — PHENYLEPHRINE HCL (PRESSORS) 10 MG/ML IV SOLN
INTRAVENOUS | Status: DC | PRN
  Administered 2020-11-16: 100 ug via INTRAVENOUS

## 2020-11-16 MED ORDER — TERBUTALINE SULFATE 1 MG/ML IJ SOLN
0.2500 mg | INTRAMUSCULAR | Status: AC
  Administered 2020-11-16: 0.25 mg via SUBCUTANEOUS

## 2020-11-16 MED ORDER — NALOXONE HCL 4 MG/10ML IJ SOLN
1.0000 ug/kg/h | INTRAVENOUS | Status: DC | PRN
  Filled 2020-11-16: qty 5

## 2020-11-16 MED ORDER — SODIUM CHLORIDE 0.9% FLUSH: 3.0000 mL | INTRAVENOUS | Status: DC | PRN

## 2020-11-16 MED ORDER — NALBUPHINE HCL 10 MG/ML IJ SOLN: 5.0000 mg | INTRAMUSCULAR | Status: DC | PRN

## 2020-11-16 MED ORDER — BUPIVACAINE 0.25 % ON-Q PUMP DUAL CATH 400 ML
400.0000 mL | INJECTION | Status: DC
  Filled 2020-11-16: qty 400

## 2020-11-16 MED ORDER — SENNOSIDES-DOCUSATE SODIUM 8.6-50 MG PO TABS
2.0000 | ORAL_TABLET | ORAL | Status: DC
  Administered 2020-11-17 – 2020-11-19 (×2): 2 via ORAL
  Filled 2020-11-16 (×2): qty 2

## 2020-11-16 MED ORDER — NIFEDIPINE 10 MG PO CAPS
10.0000 mg | ORAL_CAPSULE | Freq: Four times a day (QID) | ORAL | Status: DC
  Administered 2020-11-16: 10 mg via ORAL
  Filled 2020-11-16 (×2): qty 1

## 2020-11-16 MED ORDER — SODIUM CHLORIDE 0.9 % IV SOLN
INTRAVENOUS | Status: DC | PRN
  Administered 2020-11-16: 40 ug/min via INTRAVENOUS

## 2020-11-16 MED ORDER — ONDANSETRON HCL 4 MG/2ML IJ SOLN: 4.0000 mg | Freq: Three times a day (TID) | INTRAMUSCULAR | Status: DC | PRN

## 2020-11-16 MED ORDER — OXYTOCIN-SODIUM CHLORIDE 30-0.9 UT/500ML-% IV SOLN
INTRAVENOUS | Status: AC
  Filled 2020-11-16: qty 1000

## 2020-11-16 MED ORDER — METHYLERGONOVINE MALEATE 0.2 MG/ML IJ SOLN
INTRAMUSCULAR | Status: AC
  Filled 2020-11-16: qty 1

## 2020-11-16 MED ORDER — NALOXONE HCL 0.4 MG/ML IJ SOLN: 0.4000 mg | INTRAMUSCULAR | Status: DC | PRN

## 2020-11-16 MED ORDER — OXYTOCIN-SODIUM CHLORIDE 30-0.9 UT/500ML-% IV SOLN: 2.5000 [IU]/h | INTRAVENOUS | Status: AC

## 2020-11-16 MED ORDER — KETOROLAC TROMETHAMINE 30 MG/ML IJ SOLN
30.0000 mg | Freq: Four times a day (QID) | INTRAMUSCULAR | Status: AC | PRN
Start: 2020-11-16 — End: 2020-11-17

## 2020-11-16 MED ORDER — ACETAMINOPHEN 500 MG PO TABS
1000.0000 mg | ORAL_TABLET | Freq: Four times a day (QID) | ORAL | Status: AC
  Administered 2020-11-16 – 2020-11-17 (×4): 1000 mg via ORAL
  Filled 2020-11-16 (×4): qty 2

## 2020-11-16 MED ORDER — BETAMETHASONE SOD PHOS & ACET 6 (3-3) MG/ML IJ SUSP
12.0000 mg | Freq: Once | INTRAMUSCULAR | Status: AC
  Administered 2020-11-16: 12 mg via INTRAMUSCULAR
  Filled 2020-11-16: qty 5

## 2020-11-16 MED ORDER — TRANEXAMIC ACID-NACL 1000-0.7 MG/100ML-% IV SOLN
1000.0000 mg | INTRAVENOUS | Status: AC
  Administered 2020-11-16: 1000 mg via INTRAVENOUS

## 2020-11-16 MED ORDER — OXYCODONE HCL 5 MG PO TABS
5.0000 mg | ORAL_TABLET | ORAL | Status: DC | PRN
  Administered 2020-11-17: 5 mg via ORAL
  Filled 2020-11-16: qty 1

## 2020-11-16 MED ORDER — LACTATED RINGERS IV SOLN: INTRAVENOUS | Status: DC

## 2020-11-16 MED ORDER — COCONUT OIL OIL
1.0000 "application " | TOPICAL_OIL | Status: DC | PRN
  Filled 2020-11-16: qty 120

## 2020-11-16 MED ORDER — SOD CITRATE-CITRIC ACID 500-334 MG/5ML PO SOLN: 30.0000 mL | ORAL | Status: DC

## 2020-11-16 MED ORDER — SIMETHICONE 80 MG PO CHEW
80.0000 mg | CHEWABLE_TABLET | Freq: Three times a day (TID) | ORAL | Status: DC
  Administered 2020-11-16 – 2020-11-19 (×8): 80 mg via ORAL
  Filled 2020-11-16 (×9): qty 1

## 2020-11-16 MED ORDER — MORPHINE SULFATE (PF) 0.5 MG/ML IJ SOLN
INTRAMUSCULAR | Status: AC
  Filled 2020-11-16: qty 10

## 2020-11-16 MED ORDER — OXYTOCIN-SODIUM CHLORIDE 30-0.9 UT/500ML-% IV SOLN: INTRAVENOUS | Status: DC | PRN

## 2020-11-16 MED ORDER — SUCCINYLCHOLINE CHLORIDE 20 MG/ML IJ SOLN
INTRAMUSCULAR | Status: DC | PRN
  Administered 2020-11-16: 140 mg via INTRAVENOUS

## 2020-11-16 MED ORDER — DIPHENHYDRAMINE HCL 25 MG PO CAPS: 25.0000 mg | ORAL_CAPSULE | ORAL | Status: DC | PRN

## 2020-11-16 MED ORDER — ACETAMINOPHEN 10 MG/ML IV SOLN
INTRAVENOUS | Status: AC
  Filled 2020-11-16: qty 100

## 2020-11-16 MED ORDER — WITCH HAZEL-GLYCERIN EX PADS: 1.0000 "application " | MEDICATED_PAD | CUTANEOUS | Status: DC | PRN

## 2020-11-16 MED ORDER — LACTATED RINGERS IV BOLUS
1000.0000 mL | Freq: Once | INTRAVENOUS | Status: AC
  Administered 2020-11-16: 1000 mL via INTRAVENOUS

## 2020-11-16 MED ORDER — MENTHOL 3 MG MT LOZG
1.0000 | LOZENGE | OROMUCOSAL | Status: DC | PRN
  Filled 2020-11-16: qty 9

## 2020-11-16 MED ORDER — TRANEXAMIC ACID 1000 MG/10ML IV SOLN
INTRAVENOUS | Status: AC
  Filled 2020-11-16: qty 10

## 2020-11-16 MED ORDER — ONDANSETRON HCL 4 MG/2ML IJ SOLN
INTRAMUSCULAR | Status: DC | PRN
  Administered 2020-11-16: 4 mg via INTRAVENOUS

## 2020-11-16 MED ORDER — IBUPROFEN 800 MG PO TABS
800.0000 mg | ORAL_TABLET | Freq: Four times a day (QID) | ORAL | Status: DC
  Administered 2020-11-18 – 2020-11-19 (×4): 800 mg via ORAL
  Filled 2020-11-16 (×6): qty 1

## 2020-11-16 MED ORDER — KETOROLAC TROMETHAMINE 30 MG/ML IJ SOLN
30.0000 mg | Freq: Four times a day (QID) | INTRAMUSCULAR | Status: AC
  Filled 2020-11-16 (×3): qty 1

## 2020-11-16 MED ORDER — NALBUPHINE HCL 10 MG/ML IJ SOLN
5.0000 mg | Freq: Once | INTRAMUSCULAR | Status: DC | PRN
Start: 2020-11-16 — End: 2020-11-19

## 2020-11-16 MED ORDER — DIPHENHYDRAMINE HCL 50 MG/ML IJ SOLN: 12.5000 mg | INTRAMUSCULAR | Status: DC | PRN

## 2020-11-16 MED ORDER — SUCCINYLCHOLINE CHLORIDE 200 MG/10ML IV SOSY
PREFILLED_SYRINGE | INTRAVENOUS | Status: AC
  Filled 2020-11-16: qty 10

## 2020-11-16 MED ORDER — CARBOPROST TROMETHAMINE 250 MCG/ML IM SOLN
INTRAMUSCULAR | Status: AC
  Filled 2020-11-16: qty 1

## 2020-11-16 MED ORDER — METHYLERGONOVINE MALEATE 0.2 MG/ML IJ SOLN
INTRAMUSCULAR | Status: DC | PRN
  Administered 2020-11-16: .2 mg via INTRAMUSCULAR

## 2020-11-16 MED ORDER — MORPHINE SULFATE (PF) 2 MG/ML IV SOLN: 1.0000 mg | INTRAVENOUS | Status: DC | PRN

## 2020-11-16 MED ORDER — KETOROLAC TROMETHAMINE 30 MG/ML IJ SOLN
30.0000 mg | Freq: Four times a day (QID) | INTRAMUSCULAR | Status: AC | PRN
  Administered 2020-11-16 – 2020-11-17 (×2): 30 mg via INTRAVENOUS

## 2020-11-16 MED ORDER — DIPHENHYDRAMINE HCL 25 MG PO CAPS: 25.0000 mg | ORAL_CAPSULE | Freq: Four times a day (QID) | ORAL | Status: DC | PRN

## 2020-11-16 MED ORDER — PRENATAL MULTIVITAMIN CH
1.0000 | ORAL_TABLET | Freq: Every day | ORAL | Status: DC
  Administered 2020-11-17 – 2020-11-19 (×3): 1 via ORAL
  Filled 2020-11-16 (×3): qty 1

## 2020-11-16 MED ORDER — CARBOPROST TROMETHAMINE 250 MCG/ML IM SOLN
INTRAMUSCULAR | Status: DC | PRN
  Administered 2020-11-16 (×2): 250 ug via INTRAMUSCULAR

## 2020-11-16 MED ORDER — ZOLPIDEM TARTRATE 5 MG PO TABS: 5.0000 mg | ORAL_TABLET | Freq: Every evening | ORAL | Status: DC | PRN

## 2020-11-16 MED ORDER — KETOROLAC TROMETHAMINE 30 MG/ML IJ SOLN
INTRAMUSCULAR | Status: DC | PRN
  Administered 2020-11-16: 30 mg via INTRAVENOUS

## 2020-11-16 MED ORDER — ACETAMINOPHEN 500 MG PO TABS
1000.0000 mg | ORAL_TABLET | Freq: Four times a day (QID) | ORAL | Status: DC
  Administered 2020-11-18 – 2020-11-19 (×4): 1000 mg via ORAL
  Filled 2020-11-16 (×6): qty 2

## 2020-11-16 MED ORDER — MIDAZOLAM HCL 2 MG/2ML IJ SOLN
INTRAMUSCULAR | Status: DC | PRN
  Administered 2020-11-16: 2 mg via INTRAVENOUS

## 2020-11-16 MED ORDER — TERBUTALINE SULFATE 1 MG/ML IJ SOLN
INTRAMUSCULAR | Status: AC
  Filled 2020-11-16: qty 1

## 2020-11-16 MED ORDER — FENTANYL CITRATE (PF) 100 MCG/2ML IJ SOLN
25.0000 ug | INTRAMUSCULAR | Status: DC | PRN
  Administered 2020-11-16: 25 ug via INTRAVENOUS
  Administered 2020-11-16: 50 ug via INTRAVENOUS
  Administered 2020-11-16 (×2): 25 ug via INTRAVENOUS
  Administered 2020-11-16: 50 ug via INTRAVENOUS
  Administered 2020-11-16: 25 ug via INTRAVENOUS

## 2020-11-16 MED ORDER — ACETAMINOPHEN 500 MG PO TABS: 1000.0000 mg | ORAL_TABLET | Freq: Four times a day (QID) | ORAL | Status: DC

## 2020-11-16 MED ORDER — PROPOFOL 10 MG/ML IV BOLUS
INTRAVENOUS | Status: AC
  Filled 2020-11-16: qty 20

## 2020-11-16 MED ORDER — DIPHENOXYLATE-ATROPINE 2.5-0.025 MG PO TABS
2.0000 | ORAL_TABLET | Freq: Once | ORAL | Status: AC
  Administered 2020-11-16: 2 via ORAL
  Filled 2020-11-16: qty 2

## 2020-11-16 MED ORDER — BUPIVACAINE ON-Q PAIN PUMP (FOR ORDER SET NO CHG)
INJECTION | Status: AC
  Filled 2020-11-16: qty 1

## 2020-11-16 MED ORDER — OXYTOCIN-SODIUM CHLORIDE 30-0.9 UT/500ML-% IV SOLN
INTRAVENOUS | Status: DC | PRN
  Administered 2020-11-16: 100 mL via INTRAVENOUS

## 2020-11-16 MED ORDER — BUPIVACAINE HCL (PF) 0.5 % IJ SOLN
INTRAMUSCULAR | Status: AC
  Filled 2020-11-16: qty 30

## 2020-11-16 MED ORDER — BISACODYL 10 MG RE SUPP: 10.0000 mg | Freq: Every day | RECTAL | Status: DC | PRN

## 2020-11-16 MED ORDER — LACTATED RINGERS IV SOLN: INTRAVENOUS | Status: DC | PRN

## 2020-11-16 MED ORDER — FLEET ENEMA 7-19 GM/118ML RE ENEM: 1.0000 | ENEMA | Freq: Every day | RECTAL | Status: DC | PRN

## 2020-11-16 MED ORDER — OXYTOCIN-SODIUM CHLORIDE 30-0.9 UT/500ML-% IV SOLN
INTRAVENOUS | Status: AC
  Filled 2020-11-16: qty 500

## 2020-11-16 MED ORDER — SOD CITRATE-CITRIC ACID 500-334 MG/5ML PO SOLN
ORAL | Status: AC
  Administered 2020-11-16: 30 mL via ORAL
  Filled 2020-11-16: qty 15

## 2020-11-16 MED ORDER — MIDAZOLAM HCL 2 MG/2ML IJ SOLN
INTRAMUSCULAR | Status: AC
  Filled 2020-11-16: qty 2

## 2020-11-16 MED ORDER — SIMETHICONE 80 MG PO CHEW: 80.0000 mg | CHEWABLE_TABLET | ORAL | Status: DC | PRN

## 2020-11-16 MED ORDER — OXYTOCIN-SODIUM CHLORIDE 30-0.9 UT/500ML-% IV SOLN
INTRAVENOUS | Status: DC | PRN
  Administered 2020-11-16: 500 mL via INTRAVENOUS

## 2020-11-16 MED ORDER — BUPIVACAINE HCL (PF) 0.5 % IJ SOLN
INTRAMUSCULAR | Status: DC | PRN
  Administered 2020-11-16: 10 mL

## 2020-11-16 MED ORDER — ONDANSETRON HCL 4 MG/2ML IJ SOLN: 4.0000 mg | Freq: Once | INTRAMUSCULAR | Status: DC | PRN

## 2020-11-16 MED ORDER — ACETAMINOPHEN 10 MG/ML IV SOLN
INTRAVENOUS | Status: DC | PRN
  Administered 2020-11-16: 1000 mg via INTRAVENOUS

## 2020-11-16 MED ORDER — DIBUCAINE (PERIANAL) 1 % EX OINT: 1.0000 "application " | TOPICAL_OINTMENT | CUTANEOUS | Status: DC | PRN

## 2020-11-16 SURGICAL SUPPLY — 28 items
CANISTER SUCT 3000ML PPV (MISCELLANEOUS) ×2 IMPLANT
CHLORAPREP W/TINT 26 (MISCELLANEOUS) ×4 IMPLANT
CLOSURE STERI STRIP 1/2 X4 (GAUZE/BANDAGES/DRESSINGS) ×2 IMPLANT
DERMABOND ADHESIVE PROPEN (GAUZE/BANDAGES/DRESSINGS) ×1
DERMABOND ADVANCED (GAUZE/BANDAGES/DRESSINGS) ×1
DERMABOND ADVANCED .7 DNX12 (GAUZE/BANDAGES/DRESSINGS) ×1 IMPLANT
DERMABOND ADVANCED .7 DNX6 (GAUZE/BANDAGES/DRESSINGS) ×1 IMPLANT
DRSG OPSITE POSTOP 4X10 (GAUZE/BANDAGES/DRESSINGS) ×2 IMPLANT
ELECT CAUTERY BLADE 6.4 (BLADE) ×2 IMPLANT
ELECT REM PT RETURN 9FT ADLT (ELECTROSURGICAL) ×2
ELECTRODE REM PT RTRN 9FT ADLT (ELECTROSURGICAL) ×1 IMPLANT
GLOVE BIOGEL PI IND STRL 6.5 (GLOVE) ×2 IMPLANT
GLOVE BIOGEL PI INDICATOR 6.5 (GLOVE) ×2
GOWN STRL REUS W/ TWL LRG LVL3 (GOWN DISPOSABLE) ×1 IMPLANT
GOWN STRL REUS W/ TWL XL LVL3 (GOWN DISPOSABLE) ×2 IMPLANT
GOWN STRL REUS W/TWL LRG LVL3 (GOWN DISPOSABLE) ×1
GOWN STRL REUS W/TWL XL LVL3 (GOWN DISPOSABLE) ×2
MANIFOLD NEPTUNE II (INSTRUMENTS) ×2 IMPLANT
NS IRRIG 1000ML POUR BTL (IV SOLUTION) ×2 IMPLANT
PACK C SECTION AR (MISCELLANEOUS) ×2 IMPLANT
PAD OB MATERNITY 4.3X12.25 (PERSONAL CARE ITEMS) ×2 IMPLANT
PAD PREP 24X41 OB/GYN DISP (PERSONAL CARE ITEMS) ×2 IMPLANT
PENCIL SMOKE ULTRAEVAC 22 CON (MISCELLANEOUS) ×2 IMPLANT
SUT MNCRL AB 4-0 PS2 18 (SUTURE) ×2 IMPLANT
SUT PLAIN 3-0 (SUTURE) ×2 IMPLANT
SUT VIC AB 0 CT1 36 (SUTURE) ×6 IMPLANT
SUT VIC AB 2-0 CT1 36 (SUTURE) ×2 IMPLANT
SYR 30ML LL (SYRINGE) ×4 IMPLANT

## 2020-11-16 NOTE — Discharge Summary (Signed)
Postpartum Discharge Summary  Date of Service updated: 11/19/2020     Patient Name: Destiny Allen DOB: [birth year] MRN: [id number]  Date of admission: 11/16/2020 Delivery date:11/16/2020  Delivering provider: Adrian Prows R  Date of discharge: 11/19/2020  Admitting diagnosis: Abdominal pain during pregnancy in third trimester [O26.893, R10.9] History of cesarean section [Z98.891] Intrauterine pregnancy: [redacted]w[redacted]d     Secondary diagnosis:  Active Problems:   Postpartum care following cesarean delivery   Abdominal pain during pregnancy in third trimester   History of cesarean section   Buttocks presentation   Cesarean delivery delivered   Encounter for care or examination of lactating mother  Additional problems: none    Discharge diagnosis: Preterm Pregnancy Delivered                                              Post partum procedures:none Augmentation: N/A Complications: None  Hospital course: Sceduled C/S   35 y.o. yo Q6S3419 at [redacted]w[redacted]d was admitted to the hospital 11/16/2020 for severe abdominal pain. She was taken for a repeat cesarean section with the following indication: concern for impending uterine rupture. Delivery details are as follows:  Membrane Rupture Time/Date: 12:19 PM ,11/16/2020   Delivery Method:C-Section, Low Transverse  Details of operation can be found in separate operative note.  Patient had an uncomplicated postpartum course.  She is ambulating, tolerating a regular diet, passing flatus, and urinating well. Patient is discharged home in stable condition on  11/19/20        Newborn Data: Birth date:11/16/2020  Birth time:12:20 PM  Gender:Female  Living status:Living  Apgars:6 ,8  Weight:2000 g     Magnesium Sulfate received: No BMZ received: Yes Rhophylac:No MMR:No  T-DaP: last received in 2020 Flu: Yes Transfusion:No  Physical exam  Vitals:   11/17/20 2321 11/18/20 0823 11/18/20 2300 11/19/20 0820  BP: 108/67 109/68 109/65 131/79  Pulse: 85 97 89  98  Resp: $Remo'20 18 18 20  'bZXpj$ Temp:  98.4 F (36.9 C) 98.4 F (36.9 C) 98.4 F (36.9 C)  TempSrc: Oral Oral Oral Oral  SpO2: 99% 100% 99% 94%  Weight:      Height:       General: alert, cooperative and no distress Lochia: appropriate Uterine Fundus: firm Incision: Healing well with no significant drainage, Dressing is clean, dry, and intact DVT Evaluation: No evidence of DVT seen on physical exam. Labs: Lab Results  Component Value Date   WBC 10.8 (H) 11/17/2020   HGB 8.7 (L) 11/17/2020   HCT 26.2 (L) 11/17/2020   MCV 91.9 11/17/2020   PLT 158 11/17/2020   CMP Latest Ref Rng & Units 11/16/2020  Glucose 70 - 99 mg/dL 118(H)  BUN 6 - 20 mg/dL 13  Creatinine 0.44 - 1.00 mg/dL 0.49  Sodium 135 - 145 mmol/L 137  Potassium 3.5 - 5.1 mmol/L 3.4(L)  Chloride 98 - 111 mmol/L 105  CO2 22 - 32 mmol/L 20(L)  Calcium 8.9 - 10.3 mg/dL 9.4  Total Protein 6.5 - 8.1 g/dL 6.7  Total Bilirubin 0.3 - 1.2 mg/dL 0.4  Alkaline Phos 38 - 126 U/L 60  AST 15 - 41 U/L 18  ALT 0 - 44 U/L 12   Edinburgh Score: Edinburgh Postnatal Depression Scale Screening Tool 11/17/2020  I have been able to laugh and see the funny side of things. 0  I have  looked forward with enjoyment to things. 0  I have blamed myself unnecessarily when things went wrong. 0  I have been anxious or worried for no good reason. 0  I have felt scared or panicky for no good reason. 0  Things have been getting on top of me. 0  I have been so unhappy that I have had difficulty sleeping. 0  I have felt sad or miserable. 0  I have been so unhappy that I have been crying. 0  The thought of harming myself has occurred to me. 0  Edinburgh Postnatal Depression Scale Total 0      After visit meds:  Allergies as of 11/19/2020   No Known Allergies     Medication List    STOP taking these medications   vitamin C 250 MG tablet Commonly known as: ASCORBIC ACID     TAKE these medications   calcium acetate (Phos Binder) 667 MG/5ML  Soln Commonly known as: PHOSLYRA Take by mouth 3 (three) times daily with meals.   ferrous sulfate 325 (65 FE) MG EC tablet Take 325 mg by mouth 3 (three) times daily with meals.   Fish Oil 1000 MG Caps Take by mouth.   oxyCODONE 5 MG immediate release tablet Commonly known as: Oxy IR/ROXICODONE Take 1 tablet (5 mg total) by mouth every 6 (six) hours as needed for up to 5 days for severe pain.   Prenatal 28-0.8 MG Tabs Take by mouth.            Discharge Care Instructions  (From admission, onward)         Start     Ordered   11/19/20 0000  Discharge wound care:       Comments: Keep incision dry, clean.   11/19/20 1104           Discharge home in stable condition Infant Feeding: Breast Infant Disposition:NICU Discharge instruction: per After Visit Summary and Postpartum booklet. Activity: Advance as tolerated. Pelvic rest for 6 weeks.  Diet: routine diet Anticipated Birth Control: POPs Postpartum Appointment:1 week for incision check Additional Postpartum F/U: 6 week  Follow up Visit:  Follow-up Information    Faline Langer, Stefanie Libel, MD. Go in 1 week(s).   Specialty: Obstetrics and Gynecology Why: For incision check Contact information: [address] [place] [address] [phone]              Time spent examining patient and planning discharge: 30 minutes  11/19/2020 Rod Can, CNM

## 2020-11-16 NOTE — OB Triage Note (Signed)
Pt is G6P2 and comes in feeling intense lower abdominal pressure. Pt EDD 12/26/20 and is planning on a third section. Pt reports passing out at home yesterday after being seen by the doctor.

## 2020-11-16 NOTE — Anesthesia Procedure Notes (Addendum)
Procedure Name: Intubation Date/Time: 11/16/2020 12:16 PM Performed by: Joanette Gula, Michael Ventresca, CRNA Pre-anesthesia Checklist: Patient identified, Emergency Drugs available, Suction available and Patient being monitored Patient Re-evaluated:Patient Re-evaluated prior to induction Oxygen Delivery Method: Circle system utilized Preoxygenation: Pre-oxygenation with 100% oxygen Induction Type: IV induction, Rapid sequence and Cricoid Pressure applied Laryngoscope Size: McGraph and 3 Grade View: Grade I Tube type: Oral Tube size: 6.5 mm Number of attempts: 1 Airway Equipment and Method: Stylet Placement Confirmation: ETT inserted through vocal cords under direct vision,  positive ETCO2 and breath sounds checked- equal and bilateral Secured at: 20 cm Tube secured with: Tape Dental Injury: Teeth and Oropharynx as per pre-operative assessment

## 2020-11-16 NOTE — Op Note (Signed)
Cesarean Section Procedure Note 11/16/20  Pre-operative Diagnosis:  1. [redacted] week gestation  2. Severe constant abdominal pain, concern for impending uterine rupture.   3. History of prior cesarean section x 2  4. Breech presentation Post-operative Diagnosis: same, delivered. Procedure: Repeat Low Transverse Cesarean Section   Surgeon: Adelene Idler MD   Assistant(s): Tresea Mall CNM - No other skilled surgical assistant available. Anesthesia: General Estimated Blood Loss: 860 cc Complications: None; patient tolerated the procedure well.   Disposition: PACU - hemodynamically stable. Condition: stable   Findings: A female infant in the cephalic presentation. Amniotic fluid - clear   Birth weight: 4 lbs 6oz Apgars of 6 and 8.  Intact placenta with a three-vessel cord. Grossly normal uterus, tubes and ovaries bilaterally. No intraabdominal adhesions were noted.    Procedure Details    The patient was taken to operating room, identified as the correct patient and the procedure verified as C-Section Delivery. A time out was held and the above information confirmed. After induction of anesthesia, the patient was draped and prepped in the usual sterile manner. A Pfannenstiel incision was made and carried down through the subcutaneous tissue to the fascia. Fascial incision was made and extended transversely with the Mayo scissors. The fascia was separated from the underlying rectus tissue superiorly and inferiorly. The peritoneum was identified and entered bluntly. Peritoneal incision was extended longitudinally. A low transverse hysterotomy was made. The fetus was delivered atraumatically. The umbilical cord was clamped x2 and cut and the infant was handed to the awaiting pediatricians. The placenta was removed intact and appeared normal with a 3-vessel cord. The uterus was exteriorized and cleared of all clot and debris. The hysterotomy was closed with running sutures of 0 Vicryl suture. A  second imbricating layer was placed with the same suture. Excellent hemostasis was observed.  The uterus was returned to the abdomen. The pelvis was irrigated and again, excellent hemostasis was noted. The peritoneum was closed with a running stitch of 2-0 Vicryl. The On Q Pain pump System was then placed.  Trocars were placed through the abdominal wall into the subfascial space and these were used to thread the silver soaker cathaters into place.The rectus muscles were inspected and were hemostatic. The rectus fascia was then reapproximated with running sutures of 0-vicryl, with careful placement not to incorporate the cathaters. Subcutaneous tissues are then irrigated with saline and hemostasis assured with the bovie. The subcutaneous fat was approximated with 3-0 plain and a running stitch.  The skin was closed with 4-0 monocryl suture in a subcuticular fashion followed by skin adhesive. The cathaters are flushed each with 5 mL of Bupivicaine and stabilized into place with dressing. Instrument, sponge, and needle counts were correct prior to the abdominal closure and at the conclusion of the case.  The patient tolerated the procedure well and was transferred to the recovery room in stable condition.   Natale Milch MD [facility], [facility] 11/16/20 1:58 PM

## 2020-11-16 NOTE — H&P (Signed)
OB History & Physical   History of Present Illness:  Chief Complaint: lower abdominal pain  HPI:  Destiny Allen is a 35 y.o. [id number] female at [redacted]w[redacted]d dated by her LMP.  Her pregnancy has been complicated by history of c-section x 2, history of preterm delivery at 36 weeks with her last pregnancy, history of partial molar pregnancy. .    She reports contractions.   She denies leakage of fluid.   She denies vaginal bleeding.   She reports fetal movement.    She had abdominal pain starting about 2 days ago. She was evaluated in the office two days ago and her cervix was closed and her fFN was negative.    Total weight gain for pregnancy: 15 kg   Obstetrical Problem List: SIXTH Problems (from 05/15/20 to present)    Problem Noted Resolved   Supervision of high risk pregnancy, antepartum 02/21/2019 by Natale Milch, MD No   Overview Addendum 10/21/2020  4:12 PM by Mirna Mires, CNM      [facility] Prenatal Labs  Dating  5 wk [place] Blood type: A/Positive/-- (04/07 1636)   Genetic Screen Declined . inheritest negative   Antibody:Negative (04/07 1636)  Anatomic [place] Normal Rubella: 24.50 (04/07 1636)  Varicella: Immune  GTT Early: 71  28 wk GTT:129   RPR: Non Reactive (04/07 1636)   Rhogam  not needed HBsAg: Negative (04/07 1636)   TDaP vaccine 07/28/19               HIV: Non Reactive (04/07 1636)   Flu Shot                declines                GBS:   Contraception Natural family planning Pap: NIL 2018  CBB     CS/VBAC  Hx of cesarean section 11/13   Baby Food Breast and bottle   Support Person  Renee        Previous Version      Maternal Medical History:   Past Medical History:  Diagnosis Date  . Infertility associated with anovulation   . Ovarian cyst   . Postpartum care following cesarean delivery 08/30/2019  . Preterm labor 08/28/2019    Past Surgical History:  Procedure Laterality Date  . CESAREAN SECTION  2011  . CESAREAN SECTION N/A 08/28/2019    Procedure: CESAREAN SECTION;  Surgeon: Nadara Mustard, MD;  Location: [facility] ORS;  Service: Obstetrics;  Laterality: N/A;  . DILATION AND EVACUATION N/A 11/12/2017   Procedure: DILATATION AND EVACUATION;  Surgeon: Vena Austria, MD;  Location: [facility] ORS;  Service: Gynecology;  Laterality: N/A;    No Known Allergies  Prior to Admission medications   Medication Sig Start Date End Date Taking? Authorizing Provider  calcium acetate, Phos Binder, (PHOSLYRA) 667 MG/5ML SOLN Take by mouth 3 (three) times daily with meals.   Yes [provider]  ferrous sulfate 325 (65 FE) MG EC tablet Take 325 mg by mouth 3 (three) times daily with meals.   Yes [provider]  Omega-3 Fatty Acids (FISH OIL) 1000 MG CAPS Take by mouth.   Yes [provider]  PRENATAL 28-0.8 MG TABS Take by mouth.   Yes [provider]    OB History  Gravida Para Term Preterm AB Living  6 2 1 1 3 2   SAB IAB Ectopic Multiple Live Births  0   2 0 2    #  Outcome Date GA Lbr Len/2nd Weight Sex Delivery Anes PTL Lv  6 Current           5 Preterm 08/28/19 [redacted]w[redacted]d  3160 g M CS-LTranv Spinal  LIV  4 Molar 11/12/17             Complications: Partial molar pregnancy  3 Term 2011 [redacted]w[redacted]d  3175 g M CS-Unspec   LIV     Complications: Breech presentation  2 Ectopic           1 Ectopic             Prenatal care site: [facility]  Social History: She  reports that she has never smoked. She has never used smokeless tobacco. She reports that she does not drink alcohol and does not use drugs.  Family History: family history includes Hypertension in her father.   Review of Systems:  Review of Systems  Constitutional: Negative.   HENT: Negative.   Eyes: Negative.   Respiratory: Negative.   Cardiovascular: Negative.   Gastrointestinal: Positive for abdominal pain (contractions).  Genitourinary: Negative.   Musculoskeletal: Negative.   Skin: Negative.   Neurological: Negative.    Psychiatric/Behavioral: Negative.      Physical Exam:  BP 122/86   Pulse 97   Temp 98.2 F (36.8 C) (Oral)   Resp 20   Ht 4\' 11"  (1.499 m)   Wt 75.3 kg   LMP 03/21/2020 (Exact Date)   BMI 33.53 kg/m   Physical Exam Constitutional:      General: She is in acute distress.     Appearance: Normal appearance. She is well-developed.  Genitourinary:     Genitourinary Comments: Closed/thick/high x 2 > 2 hours apart  HENT:     Head: Normocephalic and atraumatic.  Eyes:     General: No scleral icterus.    Conjunctiva/sclera: Conjunctivae normal.  Cardiovascular:     Rate and Rhythm: Normal rate and regular rhythm.     Heart sounds: No murmur heard. No friction rub. No gallop.   Pulmonary:     Effort: Pulmonary effort is normal. No respiratory distress.     Breath sounds: Normal breath sounds. No wheezing or rales.  Abdominal:     General: Bowel sounds are normal. There is no distension.     Palpations: Abdomen is soft. There is no mass.     Tenderness: There is no abdominal tenderness. There is no guarding or rebound.  Musculoskeletal:        General: Normal range of motion.     Cervical back: Normal range of motion and neck supple.  Neurological:     General: No focal deficit present.     Mental Status: She is alert and oriented to person, place, and time.     Cranial Nerves: No cranial nerve deficit.  Skin:    General: Skin is warm and dry.     Findings: No erythema.  Psychiatric:        Mood and Affect: Mood normal.        Behavior: Behavior normal.        Judgment: Judgment normal.   Female chaperone present for pelvic exam:   Baseline FHR: 120 beats/min   Variability: moderate   Accelerations: present   Decelerations: absent Contractions: present frequency: 4-5 q 10 min Overall assessment: cat 1  Bedside Ultrasound:  Number of Fetus: 1  Presentation: transverse, back down (head maternal right)  Fluid: normal  No evidence of uterine rupture.  Fetus appears  well within the uterus cavity.    Lab Results  Component Value Date   SARSCOV2NAA NEGATIVE 08/28/2019    Assessment:  Destiny Allen is a 35 y.o. [id number] female at [redacted]w[redacted]d with contractions, history of cesarean delivery x 2, transverse presentation.   Plan:  1. Admit to Labor & Delivery for observation 2. CBC, T&S, NPO, IVF 3. GBS unknown.   4. Fetwal well-being: reassuring 5. SHe was given one dose of terbutaline, which helped her contractions subside for a while. They returned after about 45 minutes.  She was given an IVF bolus, which helped her contractions. However, after the bolus and maintenance fluid was begun, she started having painful contractions again.  She had normal labs.  She was given one dose of betamethasone at 400AM today.  Will continue to monitor.  C-section if labors or if concern for uterine rupture of fetal indications. Stable and category 1 as above at time of MD handoff.    Thomasene Mohair, MD 11/16/2020 8:57 AM

## 2020-11-16 NOTE — Progress Notes (Signed)
Fundus checks done w/vital signs. Remains firm and at U level. Pericare done and perpad replaced. Only scant amt noted. On Q intact, not infusing. Lurene Shadow, RN

## 2020-11-16 NOTE — Progress Notes (Signed)
Called for evaluation of new onset severe pain. Patient reports that in the last hour she has had a change in her pain from painful contractions to new onset severe constant pain which is wrapping around her abdomen. Patient writing in the bed. Bedside US performed, baby in Breech presentation, anterior placenta, bulging of the anterior lower uterine segment noted. Cervix unable to be to identify, appears thin.  Patient ambulated to the bathroom with extreme difficulty.  Concern for impending uterine rupture in setting of contractions and history of two previous cesarean sections, and new onset severe abdominal pain. Discussed risks of preterm delivery with patient and husband and need for likely NICU admission with baby. Will proceed with urgent cesarean section. Discussed risks of cesarean section.   Adelene Idler MD, [facility], [facility] 11/16/2020 11:35 AM

## 2020-11-16 NOTE — Anesthesia Preprocedure Evaluation (Signed)
Anesthesia Evaluation  Patient identified by MRN, date of birth, ID band Patient awake    Reviewed: Allergy & Precautions, H&P , NPO status , Patient's Chart, lab work & pertinent test results, reviewed documented beta blocker date and time   Airway Mallampati: II  TM Distance: >3 FB Neck ROM: full    Dental no notable dental hx. (+) Teeth Intact   Pulmonary neg pulmonary ROS, Current Smoker,    Pulmonary exam normal breath sounds clear to auscultation       Cardiovascular Exercise Tolerance: Good (-) hypertensionnegative cardio ROS  (-) Valvular Problems/Murmurs Rhythm:regular Rate:Normal     Neuro/Psych negative neurological ROS  negative psych ROS   GI/Hepatic negative GI ROS, Neg liver ROS,   Endo/Other  negative endocrine ROSdiabetes, Gestational  Renal/GU      Musculoskeletal   Abdominal   Peds  Hematology negative hematology ROS (+)   Anesthesia Other Findings   Reproductive/Obstetrics (+) Pregnancy                             Anesthesia Physical Anesthesia Plan  ASA: II and emergent  Anesthesia Plan: Spinal   Post-op Pain Management:    Induction:   PONV Risk Score and Plan:   Airway Management Planned:   Additional Equipment:   Intra-op Plan:   Post-operative Plan:   Informed Consent: I have reviewed the patients History and Physical, chart, labs and discussed the procedure including the risks, benefits and alternatives for the proposed anesthesia with the patient or authorized representative who has indicated his/her understanding and acceptance.       Plan Discussed with:   Anesthesia Plan Comments:         Anesthesia Quick Evaluation

## 2020-11-16 NOTE — Transfer of Care (Signed)
Immediate Anesthesia Transfer of Care Note  Patient: Destiny Allen  Procedure(s) Performed: CESAREAN SECTION  Patient Location: PACU  Anesthesia Type:General  Level of Consciousness: awake and patient cooperative  Airway & Oxygen Therapy: Patient Spontanous Breathing and Patient connected to face mask oxygen  Post-op Assessment: Report given to RN and Post -op Vital signs reviewed and stable  Post vital signs: Reviewed and stable  Last Vitals:  Vitals Value Taken Time  BP 107/63 11/16/20 1333  Temp    Pulse 88 11/16/20 1336  Resp 25 11/16/20 1336  SpO2 100 % 11/16/20 1336  Vitals shown include unvalidated device data.  Last Pain:  Vitals:   11/16/20 0707  TempSrc: Oral  PainSc: 10-Worst pain ever         Complications: No complications documented.

## 2020-11-16 NOTE — Lactation Note (Signed)
This note was copied from a baby's chart. Lactation Consultation Note  Patient Name: Destiny Allen VFIEP'P Date: 11/16/2020 Reason for consult: Initial assessment;NICU baby;Late-preterm 34-36.6wks;Infant < 6lbs;Other (Comment) (Assisted mom with first pumping) Age:37 hours   [name] was born via C/S at 34.2 weeks and was transferred to [facility].  Mom willing to begin pumping.  Symphony DEBP set up in room with instructions in warmth, breast massage, hand expression, pumping, collection, storage, cleaning, labeling and handling of expressed milk. Mom expressed a few drops which were noted on flange of pump which was rubbed on mom's nipples to lubricate, prevent bacteria and prevent tenderness.  Praised mom for her commitment to pump while [name] is in SCN.  Mom attempted to breast feed her 86 year old and 37 year old without success.  Mom perceived she did not have enough breast milk even after trying to pump.  Lactation name and number was written on white board and encouraged to call with any questions, concerns or assistance.  Maternal Data Formula Feeding for Exclusion: No Has patient been taught Hand Expression?: Yes Does the patient have breastfeeding experience prior to this delivery?: Yes  Feeding    LATCH Score                   Interventions Interventions: Breast feeding basics reviewed;Breast massage;Hand express;Pre-pump if needed;Expressed milk;Coconut oil;DEBP  Lactation Tools Discussed/Used Tools: Pump;Coconut oil Breast pump type: Double-Electric Breast Pump WIC Program: No ([employer] Insurance) Pump Education: Setup, frequency, and cleaning;Milk Storage;Other (comment) Initiated by:: S.Rubee Vega,RN,BSN,IBCLC Date initiated:: 11/16/20   Consult Status Consult Status: Follow-up Date: 11/16/20 Follow-up type: In-patient    Louis Meckel 11/16/2020, 5:39 PM

## 2020-11-17 ENCOUNTER — Encounter: Payer: Self-pay | Admitting: Obstetrics and Gynecology

## 2020-11-17 LAB — CBC
HCT: 26.2 % — ABNORMAL LOW (ref 36.0–46.0)
Hemoglobin: 8.7 g/dL — ABNORMAL LOW (ref 12.0–15.0)
MCH: 30.5 pg (ref 26.0–34.0)
MCHC: 33.2 g/dL (ref 30.0–36.0)
MCV: 91.9 fL (ref 80.0–100.0)
Platelets: 158 10*3/uL (ref 150–400)
RBC: 2.85 MIL/uL — ABNORMAL LOW (ref 3.87–5.11)
RDW: 13.9 % (ref 11.5–15.5)
WBC: 10.8 10*3/uL — ABNORMAL HIGH (ref 4.0–10.5)
nRBC: 0 % (ref 0.0–0.2)

## 2020-11-17 LAB — RPR: RPR Ser Ql: NONREACTIVE

## 2020-11-17 NOTE — Lactation Note (Addendum)
This note was copied from a baby's chart. Lactation Consultation Note  Patient Name: Destiny Allen Today's Date: 11/17/2020   Age:37 hours  Maternal Data   Mom has been pumping when she returns from visiting [place] in [facility].  She is still only expressing drops, but is not getting discouraged.  Mom denies any breasts or nipple pain.  Mom seems to think the #24 flanges feel and work better for her.  Coconut oil given to put on the flange of the pump to help get a better seal and help with tenderness.  Lactation name and number has been written on white board and encouraged mom to call with any questions, concerns or assistance. Feeding Feeding Type: Donor Breast Milk  LATCH Score                   Interventions    Lactation Tools Discussed/Used     Consult Status      Louis Meckel 11/17/2020, 4:15 PM

## 2020-11-17 NOTE — Progress Notes (Signed)
Let her know test for preterm labor last week did come back negative.  Keep appt this week.  Make sure she is alright.

## 2020-11-17 NOTE — Progress Notes (Signed)
Obstetric Postpartum/PostOperative Daily Progress Note  Chief complaints: post-operative recovery from c-sectino Subjective:  35 y.o. [id number] post-operative day # 1 status post repeat cesarean delivery.  She is ambulating, is tolerating po, is voiding spontaneously.  Her pain is well controlled on PO pain medications. Her lochia is equal to menses.  She denies feeling lightheaded and dizzy, short of breath with ambulation.    Medications SCHEDULED MEDICATIONS  . acetaminophen  1,000 mg Oral Q6H  . ketorolac  30 mg Intravenous Q6H   Followed by  . ibuprofen  800 mg Oral Q6H  . prenatal multivitamin  1 tablet Oral Q1200  . senna-docusate  2 tablet Oral Q24H  . simethicone  80 mg Oral TID PC    MEDICATION INFUSIONS  . bupivacaine 0.25 % ON-Q pump DUAL CATH 400 mL    . bupivacaine ON-Q pain pump    . lactated ringers Stopped (11/17/20 1031)  . naLOXone (NARCAN) adult infusion for PRURITIS      PRN MEDICATIONS  bisacodyl, coconut oil, witch hazel-glycerin **AND** dibucaine, diphenhydrAMINE **OR** diphenhydrAMINE, diphenhydrAMINE, ketorolac **OR** ketorolac, menthol-cetylpyridinium, morphine injection, nalbuphine **OR** nalbuphine, nalbuphine **OR** nalbuphine, naloxone **AND** sodium chloride flush, naLOXone (NARCAN) adult infusion for PRURITIS, ondansetron (ZOFRAN) IV, oxyCODONE, simethicone, sodium phosphate, zolpidem    Objective:   Vitals:   11/17/20 0600 11/17/20 0754 11/17/20 0831 11/17/20 1000  BP:  (!) 93/56 91/62   Pulse: 81 78 79 92  Resp:  18    Temp:  98.2 F (36.8 C)    TempSrc:  Oral    SpO2: 96% 97%  98%  Weight:      Height:        Current Vital Signs 24h Vital Sign Ranges  T 98.2 F (36.8 C) Temp  Avg: 97.7 F (36.5 C)  Min: 95.9 F (35.5 C)  Max: 98.8 F (37.1 C)  BP 91/62 BP  Min: 91/62  Max: 115/79  HR 92 Pulse  Avg: 83.7  Min: 74  Max: 94  RR 18 Resp  Avg: 19.4  Min: 16  Max: 26  SaO2 98 % Room Air SpO2  Avg: 95.6 %  Min: 92 %  Max: 100 %       24  Hour I/O Current Shift I/O  Time Ins Outs 01/01 0701 - 01/02 0700 In: 2540.5 [P.O.:240; I.V.:2300.5] Out: 4700 [Urine:3650] 01/02 0701 - 01/02 1900 In: 796.1 [P.O.:240; I.V.:556.1] Out: 800 [Urine:800]  General: NAD Pulmonary: no increased work of breathing Abdomen: non-distended, non-tender, fundus firm at level of umbilicus Inc: Clean/dry/intact, Bandage in place Extremities: no edema, no erythema, no tenderness  Labs:  Recent Labs  Lab 11/16/20 0344 11/17/20 0635  WBC 9.1 10.8*  HGB 12.0 8.7*  HCT 36.1 26.2*  PLT 191 158     Assessment:   34 y.o. H4V4259 postoperative day # 1 status post repeat cesarean section  Plan:  1) Acute blood loss anemia - hemodynamically stable and asymptomatic - po ferrous sulfate  2) A POS / Rubella 26.30 (07/14 1610)/ Varicella Immune  3) breast feeding (pumping) /Contraception = undecided. Discussed multiple options  4) Disposition: home POD#2-4  Conard Novak, MD, [facility] 11/17/2020 12:51 PM

## 2020-11-18 ENCOUNTER — Encounter: Payer: Self-pay | Admitting: Obstetrics and Gynecology

## 2020-11-18 LAB — TYPE AND SCREEN
ABO/RH(D): A POS
Antibody Screen: NEGATIVE
Unit division: 0
Unit division: 0
Unit division: 0
Unit division: 0
Unit division: 0
Unit division: 0

## 2020-11-18 LAB — BPAM RBC
Blood Product Expiration Date: 202201312359
Blood Product Expiration Date: 202201312359
Blood Product Expiration Date: 202202022359
Blood Product Expiration Date: 202202022359
Blood Product Expiration Date: 202202042359
Blood Product Expiration Date: 202202042359
Unit Type and Rh: 5100
Unit Type and Rh: 5100
Unit Type and Rh: 5100
Unit Type and Rh: 5100
Unit Type and Rh: 6200
Unit Type and Rh: 6200

## 2020-11-18 LAB — PREPARE RBC (CROSSMATCH)

## 2020-11-18 NOTE — Anesthesia Postprocedure Evaluation (Signed)
Anesthesia Post Note  Patient: Destiny Allen  Procedure(s) Performed: CESAREAN SECTION  Patient location during evaluation: Mother Baby Anesthesia Type: Spinal Level of consciousness: oriented and awake and alert Pain management: pain level controlled Vital Signs Assessment: post-procedure vital signs reviewed and stable Respiratory status: spontaneous breathing and respiratory function stable Cardiovascular status: blood pressure returned to baseline and stable Postop Assessment: no headache, no backache, no apparent nausea or vomiting and able to ambulate Anesthetic complications: no   No complications documented.   Last Vitals:  Vitals:   11/17/20 1600 11/17/20 2321  BP: 108/68 108/67  Pulse: 93 85  Resp: 18 20  Temp: 36.6 C 36.9 C  SpO2: 99% 99%    Last Pain:  Vitals:   11/17/20 2321  TempSrc: Oral  PainSc:                  Clydene Pugh

## 2020-11-18 NOTE — Anesthesia Post-op Follow-up Note (Signed)
  Anesthesia Pain Follow-up Note  Patient: ZAIDEE RION  Day #: 1  Date of Follow-up: 11/18/2020 Time: 7:13 AM  Last Vitals:  Vitals:   11/17/20 1600 11/17/20 2321  BP: 108/68 108/67  Pulse: 93 85  Resp: 18 20  Temp: 36.6 C 36.9 C  SpO2: 99% 99%    Level of Consciousness: alert  Pain: mild   Side Effects:None  Catheter Site Exam:clean, dry     Plan: D/C from anesthesia care at surgeon's request  Clydene Pugh

## 2020-11-18 NOTE — Progress Notes (Signed)
Subjective: Postpartum Day 2: Cesarean Delivery Patient reports tolerating PO, + flatus and no problems voiding.  She has been spending much time in the NICU with her daughter.her pain is well controlled with po medication and the On-Q. She denies heavy bleeding, incisional pain.Breastfeeding, so pumping.  She denies dizziness when getting up and walking.  Objective: Vital signs in last 24 hours: Temp:  [97.9 F (36.6 C)-98.5 F (36.9 C)] 98.4 F (36.9 C) (01/03 0823) Pulse Rate:  [85-97] 97 (01/03 0823) Resp:  [18-20] 18 (01/03 0823) BP: (108-109)/(67-68) 109/68 (01/03 0823) SpO2:  [99 %-100 %] 100 % (01/03 5003)  Physical Exam:  General: alert, cooperative, appears stated age and no distress Lochia: appropriate Uterine Fundus: firm Incision: healing well, no significant drainage, On Q in place DVT Evaluation: No evidence of DVT seen on physical exam. Negative Homan's sign. No cords or calf tenderness.  Recent Labs    11/16/20 0344 11/17/20 0635  HGB 12.0 8.7*  HCT 36.1 26.2*    Assessment/Plan: Status post Cesarean section. Doing well postoperatively.  Continue current care Anticipate discharge tomorrow.Mirna Mires 11/18/2020, 1:33 PM

## 2020-11-18 NOTE — Lactation Note (Signed)
This note was copied from a baby's chart. Lactation Consultation Note  Patient Name: Girl Danira Nylander HFWYO'V Date: 11/18/2020   Age:37 hours  Maternal Data   [name] is a 34.2 weeker having intermittent tachypnea and retractions on NCPAP.  Mom is consistently pumping and supplying 1/2 of Isabella's tube feeding of 16 ml.  The other 8 ml is DBM.  This was a surprise pregnancy.  Mom has [employer] but has not acquired DEBP through them yet.  Mom has been instructed in process of getting DEBP through insurance.  Mom may need a loaner Symphony pump until she can get her pump through her [employer].  Mom is not going to be discharged today.  Encouraged mom to call LC if assistance needed or had questions or concerns.     Feeding Feeding Type: Breast Milk with Donor Milk  LATCH Score                   Interventions    Lactation Tools Discussed/Used Tools: Pump;17F feeding tube / Syringe   Consult Status      Louis Meckel 11/18/2020, 9:38 PM

## 2020-11-19 MED ORDER — OXYCODONE HCL 5 MG PO TABS: 5.0000 mg | ORAL_TABLET | Freq: Four times a day (QID) | ORAL | 0 refills | Status: AC | PRN

## 2020-11-19 NOTE — Progress Notes (Signed)
Discharge order received from doctor. Reviewed discharge instructions including On-Q pump removal instructions and prescriptions with patient and answered all questions. Follow up appointment given. Patient verbalized understanding. ID bands checked. Patient discharged home (without infant;infant in SCN) via wheelchair by nursing/auxillary.    Inge Rise, RN

## 2020-11-19 NOTE — Lactation Note (Signed)
This note was copied from a baby's chart. Lactation Consultation Note  Patient Name: Destiny Allen [id number] Date: 11/19/2020 Reason for consult: Follow-up assessment;Late-preterm 34-36.6wks;Infant < 6lbs;NICU baby Age:37 hours  Lactation to bed side for attempted lick and learn. Baby had oxygen removed and was placed skin to skin with mom in football hold on the left side. Baby remained asleep throughout oxygen removal and transition to mom, non-cuing. Mom attempted for several minutes to get baby to accept the breast. RN and LC provided reassurance to mom re: baby's behavior, feeding patterns, patterns of alertness, and benefits of baby being at breast while receiving tube feeding. Mom verbalized understanding. LC and RN also set-up DEBP at bedside for mom to pump post spending time skin to skin. Mom is starting to experience tightness in the breast tissue, and was encouraged to pump soon and use breast massage throughout pumping session. Encouraged to call lactation for support as needed.  Maternal Data Formula Feeding for Exclusion: No Has patient been taught Hand Expression?: Yes Does the patient have breastfeeding experience prior to this delivery?: Yes  Feeding Feeding Type: Breast Milk  LATCH Score                   Interventions Interventions: Breast feeding basics reviewed;DEBP  Lactation Tools Discussed/Used     Consult Status Consult Status: PRN Date: 11/19/20 Follow-up type: Call as needed    Danford Bad 11/19/2020, 12:46 PM

## 2020-11-19 NOTE — Discharge Instructions (Signed)
Please call your doctor or return to the ER if you experience any chest pains, shortness of breath, dizziness, visual changes, severe headache (unrelieved by pain meds), fever greater than 101, any heavy bleeding (saturating more than 1 pad per hour), large clots, or foul smelling discharge, any worsening abdominal pain and cramping that is not controlled by pain medication, any calf/leg pain or redness, any breast concerns (redness/pain), or any signs of postpartum depression. No tampons, enemas, douches, or sexual intercourse for 6 weeks. Also avoid tub baths, hot tubs, or swimming for 6 weeks.    Check your incision daily for any signs of infection such as redness, warmth, swelling, increased pain, our pus/foul smelling drainage  Activity: do not lift over 10-15 lbs for 6 weeks  No driving for 1-2 weeks  Pelvic rest for 6 weeks Showers only for 6 weeks

## 2020-11-19 NOTE — Lactation Note (Signed)
This note was copied from a baby's chart. Lactation Consultation Note  Patient Name: Destiny Allen [id number] Date: 11/19/2020 Reason for consult: Follow-up assessment;Late-preterm 34-36.6wks;Infant < 6lbs;NICU baby Age:37 hours  Lactation follow-up before mom's anticipated discharge. Mom has continued to pump routinely and provide at least half of baby's need at feeding times. She has contacted her insurance for electric breast pump, and we will provide one for her until her's comes in. [facility] reviewed that process to mom, educated on set-up at home, and provided a second pump kit. At this time mom had no questions/concerns. Encouraged her to reach out to lactation at any point, and any point when she is visiting.  Maternal Data Formula Feeding for Exclusion: No Has patient been taught Hand Expression?: Yes Does the patient have breastfeeding experience prior to this delivery?: Yes  Feeding Feeding Type: Breast Milk with Donor Milk  LATCH Score                   Interventions Interventions: Breast feeding basics reviewed;DEBP  Lactation Tools Discussed/Used     Consult Status Consult Status: PRN Date: 11/19/20 Follow-up type: Call as needed    Lavonia Drafts 11/19/2020, 11:41 AM

## 2020-11-20 ENCOUNTER — Ambulatory Visit: Payer: Self-pay

## 2020-11-20 NOTE — Lactation Note (Signed)
This note was copied from a baby's chart. Lactation Consultation Note  Patient Name: Destiny Allen Today's Date: 11/20/2020   Age:37 days  Lactation assisted with milk expression at bedside. Mom reports that pump given yesterday was not effective at home on her Left breast, she has knots, swollen, and hot, with little volumes expressed.  LC used all given pump parts on pump at bedside and assisted with breast massage, compression, and warmth during 15 minute pump session. Left breast expressed 58mL, right breast the same. Mom reports feeling much better. LC to return at 1330 to assist with pumping using the DEBP given yesterday to ensure everything is working adequately before mom leaves from visiting. LC educated mom on use of warmth while pumping, breast massage/compression, and ice post pumping for swelling comfort.  Maternal Data    Feeding Feeding Type: Breast Milk with Formula added  LATCH Score                   Interventions    Lactation Tools Discussed/Used Tools: 27F feeding tube / Syringe;Pump   Consult Status      Danford Bad 11/20/2020, 12:21 PM

## 2020-11-20 NOTE — Lactation Note (Signed)
This note was copied from a baby's chart. Lactation Consultation Note  Patient Name: Destiny Allen Today's Date: 11/20/2020   Age:37 days  Lactation at bedside to assist with pumping session 2hrs after the last session. This time LC set-up the pump that was loaned out to mom, and provided assistance with breast warmth and massage throughout pumping. Pump worked appropriately, mom able to relieve both breasts without difficulty; output of 59mL (R) and 42mL (L). LC provided guidance for tonight at home to use warm cloths on breasts while pumping along with breast massage and compression, pump post shower, and sit forward, allowing gravity to assist with milk expression.  Encouraged to seek [facility] support as needed when visiting, or call with questions.   Danford Bad 11/20/2020, 2:18 PM

## 2020-11-21 ENCOUNTER — Telehealth: Payer: Self-pay

## 2020-11-21 NOTE — Telephone Encounter (Signed)
Spoke w/patient. Her Due date was 12/2020. She delivered 11/16/20. Needs paperwork updated to reflect and refaed to employer. Per employer-ok to cross out and change dates to reflect.

## 2020-11-21 NOTE — Telephone Encounter (Signed)
Patient has questions about her FMLA paperwork. Inquiring if it can be fixed for her? [phone]

## 2020-11-25 ENCOUNTER — Encounter: Payer: 59 | Admitting: Obstetrics and Gynecology

## 2020-11-25 ENCOUNTER — Ambulatory Visit: Payer: 59

## 2020-11-25 NOTE — Telephone Encounter (Signed)
done

## 2020-11-27 ENCOUNTER — Ambulatory Visit (INDEPENDENT_AMBULATORY_CARE_PROVIDER_SITE_OTHER): Payer: 59 | Admitting: Obstetrics and Gynecology

## 2020-11-27 ENCOUNTER — Encounter: Payer: Self-pay | Admitting: Obstetrics and Gynecology

## 2020-11-27 ENCOUNTER — Other Ambulatory Visit: Payer: Self-pay

## 2020-11-27 DIAGNOSIS — Z98891 History of uterine scar from previous surgery: Secondary | ICD-10-CM

## 2020-11-27 NOTE — Patient Instructions (Addendum)
Postpartum Care After Cesarean Delivery This sheet gives you information about how to care for yourself from the time you deliver your baby to up to 6-12 weeks after delivery (postpartum period). Your health care provider may also give you more specific instructions. If you have problems or questions, contact your health care provider. Follow these instructions at home: Medicines  Take over-the-counter and prescription medicines only as told by your health care provider.  If you were prescribed an antibiotic medicine, take it as told by your health care provider. Do not stop taking the antibiotic even if you start to feel better.  Ask your health care provider if the medicine prescribed to you: ? Requires you to avoid driving or using heavy machinery. ? Can cause constipation. You may need to take actions to prevent or treat constipation, such as:  Drink enough fluid to keep your urine pale yellow.  Take over-the-counter or prescription medicines.  Eat foods that are high in fiber, such as beans, whole grains, and fresh fruits and vegetables.  Limit foods that are high in fat and processed sugars, such as fried or sweet foods. Activity  Gradually return to your normal activities as told by your health care provider.  Avoid activities that take a lot of effort and energy (are strenuous) until approved by your health care provider. Walking at a slow to moderate pace is usually safe. Ask your health care provider what activities are safe for you. ? Do not lift anything that is heavier than your baby or 10 lb (4.5 kg) as told by your health care provider. ? Do not vacuum, climb stairs, or drive a car for as long as told by your health care provider.  If possible, have someone help you at home until you are able to do your usual activities yourself.  Rest as much as possible. Try to rest or take naps while your baby is sleeping. Vaginal bleeding  It is normal to have vaginal bleeding  (lochia) after delivery. Wear a sanitary pad to absorb vaginal bleeding and discharge. ? During the first week after delivery, the amount and appearance of lochia is often similar to a menstrual period. ? Over the next few weeks, it will gradually decrease to a dry, yellow-brown discharge. ? For most women, lochia stops completely by 4-6 weeks after delivery. Vaginal bleeding can vary from woman to woman.  Change your sanitary pads frequently. Watch for any changes in your flow, such as: ? A sudden increase in volume. ? A change in color. ? Large blood clots.  If you pass a blood clot, save it and call your health care provider to discuss. Do not flush blood clots down the toilet before you get instructions from your health care provider.  Do not use tampons or douches until your health care provider says this is safe.  If you are not breastfeeding, your period should return 6-8 weeks after delivery. If you are breastfeeding, your period may return anytime between 8 weeks after delivery and the time that you stop breastfeeding. Perineal care  If your C-section (Cesarean section) was unplanned, and you were allowed to labor and push before delivery, you may have pain, swelling, and discomfort of the tissue between your vaginal opening and your anus (perineum). You may also have an incision in the tissue (episiotomy) or the tissue may have torn during delivery. Follow these instructions as told by your health care provider: ? Keep your perineum clean and dry as told by your   health care provider. Use medicated pads and pain-relieving sprays and creams as directed. ? If you have an episiotomy or vaginal tear, check the area every day for signs of infection. Check for:  Redness, swelling, or pain.  Fluid or blood.  Warmth.  Pus or a bad smell. ? You may be given a squirt bottle to use instead of wiping to clean the perineum area after you go to the bathroom. As you start healing, you may use  the squirt bottle before wiping yourself. Make sure to wipe gently. ? To relieve pain caused by an episiotomy, vaginal tear, or hemorrhoids, try taking a warm sitz bath 2-3 times a day. A sitz bath is a warm water bath that is taken while you are sitting down. The water should only come up to your hips and should cover your buttocks.   Breast care  Within the first few days after delivery, your breasts may feel heavy, full, and uncomfortable (breast engorgement). You may also have milk leaking from your breasts. Your health care provider can suggest ways to help relieve breast discomfort. Breast engorgement should go away within a few days.  If you are breastfeeding: ? Wear a bra that supports your breasts and fits you well. ? Keep your nipples clean and dry. Apply creams and ointments as told by your health care provider. ? You may need to use breast pads to absorb milk leakage. ? You may have uterine contractions every time you breastfeed for several weeks after delivery. Uterine contractions help your uterus return to its normal size. ? If you have any problems with breastfeeding, work with your health care provider or a lactation consultant.  If you are not breastfeeding: ? Avoid touching your breasts as this can make your breasts produce more milk. ? Wear a well-fitting bra and use cold packs to help with swelling. ? Do not squeeze out (express) milk. This causes you to make more milk. Intimacy and sexuality  Ask your health care provider when you can engage in sexual activity. This may depend on your: ? Risk of infection. ? Healing rate. ? Comfort and desire to engage in sexual activity.  You are able to get pregnant after delivery, even if you have not had your period. If desired, talk with your health care provider about methods of family planning or birth control (contraception). Lifestyle  Do not use any products that contain nicotine or tobacco, such as cigarettes, e-cigarettes,  and chewing tobacco. If you need help quitting, ask your health care provider.  Do not drink alcohol, especially if you are breastfeeding. Eating and drinking  Drink enough fluid to keep your urine pale yellow.  Eat high-fiber foods every day. These may help prevent or relieve constipation. High-fiber foods include: ? Whole grain cereals and breads. ? Brown rice. ? Beans. ? Fresh fruits and vegetables.  Take your prenatal vitamins until your postpartum checkup or until your health care provider tells you it is okay to stop.   General instructions  Keep all follow-up visits for you and your baby as told by your health care provider. Most women visit their health care provider for a postpartum checkup within the first 3-6 weeks after delivery. Contact a health care provider if you:  Feel unable to cope with the changes that a new baby brings to your life, and these feelings do not go away.  Feel unusually sad or worried.  Have breasts that are painful, hard, or turn red.  Have a   fever.  Have trouble holding urine or keeping urine from leaking.  Have little or no interest in activities you used to enjoy.  Have not breastfed at all and you have not had a menstrual period for 12 weeks after delivery.  Have stopped breastfeeding and you have not had a menstrual period for 12 weeks after you stopped breastfeeding.  Have questions about caring for yourself or your baby.  Pass a blood clot from your vagina. Get help right away if you:  Have chest pain.  Have difficulty breathing.  Have sudden, severe leg pain.  Have severe pain or cramping in your abdomen.  Bleed from your vagina so much that you fill more than one sanitary pad in one hour. Bleeding should not be heavier than your heaviest period.  Develop a severe headache.  Faint.  Have blurred vision or spots in your vision.  Have a bad-smelling vaginal discharge.  Have thoughts about hurting yourself or your  baby. If you ever feel like you may hurt yourself or others, or have thoughts about taking your own life, get help right away. You can go to your nearest emergency department or call:  Your local emergency services (911 in the [place]  A suicide crisis helpline, such as the National Suicide Prevention Lifeline at 848-002-9271. This is open 24 hours a day. Summary  The period of time from when you deliver your baby to up to 6-12 weeks after delivery is called the postpartum period.  Gradually return to your normal activities as told by your health care provider.  Keep all follow-up visits for you and your baby as told by your health care provider. This information is not intended to replace advice given to you by your health care provider. Make sure you discuss any questions you have with your health care provider. Document Revised: 06/22/2018 Document Reviewed: 06/22/2018 Elsevier Patient Education  2021 [employer] Inc.    Hemorrhoids Hemorrhoids are swollen veins in and around the rectum or anus. There are two types of hemorrhoids:  Internal hemorrhoids. These occur in the veins that are just inside the rectum. They may poke through to the outside and become irritated and painful.  External hemorrhoids. These occur in the veins that are outside the anus and can be felt as a painful swelling or hard lump near the anus. Most hemorrhoids do not cause serious problems, and they can be managed with home treatments such as diet and lifestyle changes. If home treatments do not help the symptoms, procedures can be done to shrink or remove the hemorrhoids. What are the causes? This condition is caused by increased pressure in the anal area. This pressure may result from various things, including:  Constipation.  Straining to have a bowel movement.  Diarrhea.  Pregnancy.  Obesity.  Sitting for long periods of time.  Heavy lifting or other activity that causes you to strain.  Anal  sex.  Riding a bike for a long period of time. What are the signs or symptoms? Symptoms of this condition include:  Pain.  Anal itching or irritation.  Rectal bleeding.  Leakage of stool (feces).  Anal swelling.  One or more lumps around the anus. How is this diagnosed? This condition can often be diagnosed through a visual exam. Other exams or tests may also be done, such as:  An exam that involves feeling the rectal area with a gloved hand (digital rectal exam).  An exam of the anal canal that is done using a small  tube (anoscope).  A blood test, if you have lost a significant amount of blood.  A test to look inside the colon using a flexible tube with a camera on the end (sigmoidoscopy or colonoscopy). How is this treated? This condition can usually be treated at home. However, various procedures may be done if dietary changes, lifestyle changes, and other home treatments do not help your symptoms. These procedures can help make the hemorrhoids smaller or remove them completely. Some of these procedures involve surgery, and others do not. Common procedures include:  Rubber band ligation. Rubber bands are placed at the base of the hemorrhoids to cut off their blood supply.  Sclerotherapy. Medicine is injected into the hemorrhoids to shrink them.  Infrared coagulation. A type of light energy is used to get rid of the hemorrhoids.  Hemorrhoidectomy surgery. The hemorrhoids are surgically removed, and the veins that supply them are tied off.  Stapled hemorrhoidopexy surgery. The surgeon staples the base of the hemorrhoid to the rectal wall. Follow these instructions at home: Eating and drinking  Eat foods that have a lot of fiber in them, such as whole grains, beans, nuts, fruits, and vegetables.  Ask your health care provider about taking products that have added fiber (fiber supplements).  Reduce the amount of fat in your diet. You can do this by eating low-fat dairy  products, eating less red meat, and avoiding processed foods.  Drink enough fluid to keep your urine pale yellow.   Managing pain and swelling  Take warm sitz baths for 20 minutes, 3-4 times a day to ease pain and discomfort. You may do this in a bathtub or using a portable sitz bath that fits over the toilet.  If directed, apply ice to the affected area. Using ice packs between sitz baths may be helpful. ? Put ice in a plastic bag. ? Place a towel between your skin and the bag. ? Leave the ice on for 20 minutes, 2-3 times a day.   General instructions  Take over-the-counter and prescription medicines only as told by your health care provider.  Use medicated creams or suppositories as told.  Get regular exercise. Ask your health care provider how much and what kind of exercise is best for you. In general, you should do moderate exercise for at least 30 minutes on most days of the week (150 minutes each week). This can include activities such as walking, biking, or yoga.  Go to the bathroom when you have the urge to have a bowel movement. Do not wait.  Avoid straining to have bowel movements.  Keep the anal area dry and clean. Use wet toilet paper or moist towelettes after a bowel movement.  Do not sit on the toilet for long periods of time. This increases blood pooling and pain.  Keep all follow-up visits as told by your health care provider. This is important. Contact a health care provider if you have:  Increasing pain and swelling that are not controlled by treatment or medicine.  Difficulty having a bowel movement, or you are unable to have a bowel movement.  Pain or inflammation outside the area of the hemorrhoids. Get help right away if you have:  Uncontrolled bleeding from your rectum. Summary  Hemorrhoids are swollen veins in and around the rectum or anus.  Most hemorrhoids can be managed with home treatments such as diet and lifestyle changes.  Taking warm sitz  baths can help ease pain and discomfort.  In severe cases, procedures  or surgery can be done to shrink or remove the hemorrhoids. This information is not intended to replace advice given to you by your health care provider. Make sure you discuss any questions you have with your health care provider. Document Revised: 03/31/2019 Document Reviewed: 03/24/2018 Elsevier Patient Education  2021 [employer].

## 2020-11-27 NOTE — Progress Notes (Signed)
PP Check ( incision check)

## 2020-11-27 NOTE — Progress Notes (Signed)
  OBSTETRICS POSTPARTUM CLINIC PROGRESS NOTE  Subjective:     Destiny Allen is a 35 y.o. [phone] female who presents for a postpartum visit. She is 2 weeks postpartum following a Preterm pregnancy <37 weeks and delivery by C-section emergent.  I have fully reviewed the prenatal and intrapartum course. Anesthesia: general.  Postpartum course has been complicated by uncomplicated.  Baby is feeding by Breast.  Bleeding: patient has not  resumed menses.  Bowel function is normal. Bladder function is normal.  Patient is not sexually active. Contraception method desired is OCP, vs IUD, vs nexplanon.  Postpartum depression screening: negative. Edinburgh 3.  The following portions of the patient's history were reviewed and updated as appropriate: allergies, current medications, past family history, past medical history, past social history, past surgical history and problem list.  Review of Systems Pertinent items are noted in HPI.  Objective:    BP 112/70   Ht 4\' 11"  (1.499 m)   Wt 153 lb (69.4 kg)   Breastfeeding Yes   BMI 30.90 kg/m   General:  alert and no distress   Breasts:  inspection negative, no nipple discharge or bleeding, no masses or nodularity palpable  Lungs: clear to auscultation bilaterally  Heart:  regular rate and rhythm, S1, S2 normal, no murmur, click, rub or gallop  Abdomen: soft, non-tender; bowel sounds normal; no masses,  no organomegaly.   Well healed Pfannenstiel incision                 Rectal Exam: Not performed. Pt declined          Assessment:  Post Partum Care visit There are no diagnoses linked to this encounter.  Plan:  See orders and Patient Instructions Follow up in: 4 weeks or as needed.   Discussed hemorrhoid management Discussed birth control options, given brochures  MD, Adelene Idler OB/GYN, [facility] 11/27/2020 10:42 AM

## 2020-12-07 ENCOUNTER — Ambulatory Visit: Payer: Self-pay

## 2020-12-07 NOTE — Lactation Note (Signed)
This note was copied from a baby's chart. Lactation Consultation Note  Patient Name: Destiny Allen [id number] Date: 12/07/2020 Reason for consult: Follow-up assessment;Late-preterm 34-36.6wks;NICU baby Age:37 wk.o.  LC called to bedside to assist with feed. Baby is attempting latch in football on the left. Mother stated this is the time she would usually pump. Her breasts are full, milk is dripping. LC assisted with positioning the baby more at breast height in football. Baby is attempting latch. She did latch once to the breast for a minute with a few swallows but did not have a rhythmic suck. LC placed 24 mm nipple shield. Reviewed how to place shield onto the breast. Baby then opened wide, latched and had a rhythmic sucking pattern for about 5-6 minutes. She did well, tired out after the 7 minutes and fell asleep. Baby's posture is relaxed and content. Care RN updated. Mother was encouraged to burp her. Mother stated pumping was going well and she has no questions at this time.     Feeding Feeding Type: Breast Milk Nipple Type: Nfant Slow Flow (purple)  LATCH Score Latch: Repeated attempts needed to sustain latch, nipple held in mouth throughout feeding, stimulation needed to elicit sucking reflex. (24 mm nipple shield)  Audible Swallowing: Spontaneous and intermittent  Type of Nipple: Everted at rest and after stimulation  Comfort (Breast/Nipple): Filling, red/small blisters or bruises, mild/mod discomfort  Hold (Positioning): Assistance needed to correctly position infant at breast and maintain latch.  LATCH Score: 7  Interventions Interventions: Assisted with latch;Breast compression;Adjust position;Support pillows;Position options (nipple shield)  Lactation Tools Discussed/Used     Consult Status Consult Status: PRN Follow-up type: Call as needed    Destiny Allen 12/07/2020, 3:45 PM

## 2020-12-25 ENCOUNTER — Other Ambulatory Visit: Payer: Self-pay

## 2020-12-25 ENCOUNTER — Encounter: Payer: Self-pay | Admitting: Obstetrics and Gynecology

## 2020-12-25 ENCOUNTER — Ambulatory Visit (INDEPENDENT_AMBULATORY_CARE_PROVIDER_SITE_OTHER): Payer: 59 | Admitting: Obstetrics and Gynecology

## 2020-12-25 DIAGNOSIS — Z3041 Encounter for surveillance of contraceptive pills: Secondary | ICD-10-CM

## 2020-12-25 DIAGNOSIS — Z98891 History of uterine scar from previous surgery: Secondary | ICD-10-CM

## 2020-12-25 MED ORDER — LO LOESTRIN FE 1 MG-10 MCG / 10 MCG PO TABS: 1.0000 | ORAL_TABLET | Freq: Every day | ORAL | 3 refills | Status: AC

## 2020-12-25 NOTE — Patient Instructions (Signed)
Laparoscopic Tubal Ligation Laparoscopic tubal ligation is a procedure to close the fallopian tubes. This is done to prevent pregnancy. When the fallopian tubes are closed, the eggs that your ovaries release cannot enter the uterus, and sperm cannot reach the released eggs. You should not have this procedure if you want to get pregnant someday or if you are unsure about having more children. Tell a health care provider about:  Any allergies you have.  All medicines you are taking, including vitamins, herbs, eye drops, creams, and over-the-counter medicines.  Any problems you or family members have had with anesthetic medicines.  Any blood disorders you have.  Any surgeries you have had.  Any medical conditions you have.  Whether you are pregnant or may be pregnant.  Any past pregnancies. What are the risks? Generally, this is a safe procedure. However, problems may occur, including:  Infection.  Bleeding.  Injury to other organs in the abdomen.  Side effects from anesthetic medicines.  Failure of the procedure. This procedure can increase your risk of an ectopic pregnancy. This is a pregnancy in which a fertilized egg attaches to the outside of the uterus. What happens before the procedure? Staying hydrated Follow instructions from your health care provider about hydration, which may include:  Up to 2 hours before the procedure - you may continue to drink clear liquids, such as water, clear fruit juice, black coffee, and plain tea. Eating and drinking restrictions Follow instructions from your health care provider about eating and drinking, which may include:  8 hours before the procedure - stop eating heavy meals or foods, such as meat, fried foods, or fatty foods.  6 hours before the procedure - stop eating light meals or foods, such as toast or cereal.  6 hours before the procedure - stop drinking milk or drinks that contain milk.  2 hours before the procedure - stop  drinking clear liquids. Medicines Ask your health care provider about:  Changing or stopping your regular medicines. This is especially important if you are taking diabetes medicines or blood thinners.  Taking medicines such as aspirin and ibuprofen. These medicines can thin your blood. Do not take these medicines unless your health care provider tells you to take them.  Taking over-the-counter medicines, vitamins, herbs, and supplements. Surgery safety Ask your health care provider:  How your surgery site will be marked.  What steps will be taken to help prevent infection. These steps may include: ? Removing hair at the surgery site. ? Washing skin with a germ-killing soap. ? Taking antibiotic medicine. General instructions  Do not use any products that contain nicotine or tobacco for at least 4 weeks before the procedure. These products include cigarettes, chewing tobacco, and vaping devices, such as e-cigarettes. If you need help quitting, ask your health care provider.  Plan to have someone take you home from the hospital.  If you will be going home right after the procedure, plan to have a responsible adult care for you for the time you are told. This is important. What happens during the procedure?  An IV will be inserted into one of your veins.  You will be given one or more of the following: ? A medicine to help you relax (sedative). ? A medicine to numb the area (local anesthetic). ? A medicine to make you fall asleep (general anesthetic). ? A medicine that is injected into an area of your body to numb everything below the injection site (regional anesthetic).  Your bladder   may be emptied with a small tube (catheter).  If you have been given a general anesthetic, a tube will be put down your throat to help you breathe.  Two small incisions will be made in your lower abdomen and near your belly button.  Your abdomen will be inflated with a gas. This will let the  surgeon see better and will give the surgeon room to work.  A lighted tube with camera (laparoscope) will be inserted into your abdomen through one of the incisions. Small instruments will be inserted through the other incision.  The fallopian tubes will be tied off, burned (cauterized), or blocked with a clip, ring, or clamp. A small portion in the center of each fallopian tube may be removed.  The gas will be released from the abdomen.  The incisions will be closed with stitches (sutures).  A bandage (dressing) will be placed over the incisions. The procedure may vary among health care providers and hospitals.      What happens after the procedure?  Your blood pressure, heart rate, breathing rate, and blood oxygen level will be monitored until you leave the hospital.  You will be given medicine to help with pain, nausea, and vomiting as needed.  You may have vaginal discharge after the procedure. You may need to wear a sanitary napkin.  If you were given a sedative during the procedure, it can affect you for several hours. Do not drive or operate machinery until your health care provider says that it is safe. Summary  Laparoscopic tubal ligation is a procedure that is done to prevent pregnancy.  You should not have this procedure if you want to get pregnant someday or if you are unsure about having more children.  The procedure is done using a thin, lighted tube (laparoscope) with a camera attached that will be inserted into your abdomen through an incision.  After the procedure you will be given medicine to help with pain, nausea, and vomiting as needed.  Plan to have someone take you home from the hospital. This information is not intended to replace advice given to you by your health care provider. Make sure you discuss any questions you have with your health care provider. Document Revised: 07/19/2020 Document Reviewed: 07/19/2020 Elsevier Patient Education  2021 [employer] Laparoscopic Tubal Ligation, Care After The following information offers guidance on how to care for yourself after your procedure. Your health care provider may also give you more specific instructions. If you have problems or questions, contact your health care provider. What can I expect after the procedure? After the procedure, it is common to have:  A sore throat if general anesthesia was used.  Pain in shoulders, back, and abdomen. This is caused by the gas that was used during the procedure.  Mild discomfort or cramping in your abdomen.  Pain or soreness in the area where the surgical incision was made.  A bloated feeling.  Tiredness.  Nausea and vomiting. Follow these instructions at home: Medicines  Take over-the-counter and prescription medicines only as told by your health care provider.  Ask your health care provider if the medicine prescribed to you: ? Requires you to avoid driving or using heavy machinery. ? Can cause constipation. You may need to take these actions to prevent or treat constipation:  Drink enough fluid to keep your urine pale yellow.  Take over-the-counter or prescription medicines.  Eat foods that are high in fiber, such as beans, whole grains, and fresh fruits   and vegetables.  Limit foods that are high in fat and processed sugars, such as fried or sweet foods.  Do not take aspirin because it can cause bleeding. Incision care  Follow instructions from your health care provider about how to take care of your incision. Make sure you: ? Wash your hands with soap and water for at least 20 seconds before and after you change your bandage (dressing). If soap and water are not available, use hand sanitizer. ? Change your dressing as told by your health care provider. ? Leave stitches (sutures), skin glue, or adhesive strips in place. These skin closures may need to stay in place for 2 weeks or longer. If adhesive strip edges start to loosen and  curl up, you may trim the loose edges. Do not remove adhesive strips completely unless your health care provider tells you to do that.  Check your incision area every day for signs of infection. Check for: ? Redness, swelling, or more pain. ? Fluid or blood. ? Warmth. ? Pus or a bad smell.      Activity  Rest as told by your health care provider.  Avoid sitting for a long time without moving. Get up to take short walks every 1-2 hours. This is important to improve blood flow and breathing. Ask for help if you feel weak or unsteady.  Do not have sex, douche, or put a tampon or anything else in your vagina for 6 weeks or as long as told by your health care provider.  Do not lift anything that is heavier than your baby for 2 weeks, or the limit that you are told, until your health care provider says that it is safe.  Do not take baths, swim, or use a hot tub until your health care provider approves. Ask your health care provider if you may take showers. You may only be allowed to take sponge baths.  Return to your normal activities as told by your health care provider. Ask your health care provider what activities are safe for you. General instructions  After the procedure you may need to wear a sanitary pad for vaginal discharge.  Have someone help you with your daily household tasks for the first few days.  Keep all follow-up visits. This is important. Contact a health care provider if:  You have redness, swelling, or more pain around your incision.  Your incision feels warm to the touch.  You have pus or a bad smell coming from your incision.  The edges of your incision break open after the sutures have been removed.  Your pain does not improve after 2-3 days.  You have a rash.  You repeatedly become dizzy or light-headed.  Your pain medicine is not helping. Get help right away if:  You have a fever or chills.  You faint.  You have increasing pain in your  abdomen.  You have severe pain in one or both of your shoulders.  You have fluid or blood coming from your sutures or heavy bleeding from your vagina.  You have shortness of breath or difficulty breathing.  You have chest pain, leg pain, or leg swelling.  You have ongoing nausea, vomiting, or diarrhea. These symptoms may represent a serious problem that is an emergency. Do not wait to see if the symptoms will go away. Get medical help right away. Call your local emergency services (911 in the [place] Do not drive yourself to the hospital. Summary  After the procedure, it is   procedure, it is common to have mild discomfort or cramping in your abdomen.  After the procedure you may need to wear a sanitary pad for vaginal discharge.  Take over-the-counter and prescription medicines only as told by your health care provider.  Watch for symptoms that should prompt you to call your health care provider.  Keep all follow-up visits. This is important. This information is not intended to replace advice given to you by your health care provider. Make sure you discuss any questions you have with your health care provider. Document Revised: 07/19/2020 Document Reviewed: 07/19/2020 Elsevier Patient Education  2021 [employer].

## 2020-12-25 NOTE — Progress Notes (Signed)
  OBSTETRICS POSTPARTUM CLINIC PROGRESS NOTE  Subjective:     ELYSE PREVO is a 35 y.o. [phone] female who presents for a postpartum visit. She is 6 weeks postpartum following a Preterm pregnancy <37 weeks and delivery by C-section.  I have fully reviewed the prenatal and intrapartum course. Anesthesia: general Postpartum course has been complicated by uncomplicated.  Baby is feeding by Breast.  Bleeding: patient has not  resumed menses.  Bowel function is normal. Bladder function is normal.  Patient is not sexually active. Contraception method desired is OCP (estrogen/progesterone).  Postpartum depression screening: negative. Edinburgh 2.  The following portions of the patient's history were reviewed and updated as appropriate: allergies, current medications, past family history, past medical history, past social history, past surgical history and problem list.  Review of Systems Pertinent items are noted in HPI.  Objective:    BP 106/70   Ht 4\' 11"  (1.499 m)   Wt 143 lb (64.9 kg)   Breastfeeding Yes   BMI 28.88 kg/m   General:  alert and no distress   Breasts:  inspection negative, no nipple discharge or bleeding, no masses or nodularity palpable  Lungs: clear to auscultation bilaterally  Heart:  regular rate and rhythm, S1, S2 normal, no murmur, click, rub or gallop  Abdomen: soft, non-tender; bowel sounds normal; no masses,  no organomegaly.  Well healed Pfannenstiel incision                 Rectal Exam: Not performed.          Assessment:  Post Partum Care visit There are no diagnoses linked to this encounter.  Plan:  See orders and Patient Instructions Follow up in: 12 weeks or as needed.   She is considering tubal ligation. Provided her with information.  MD, Adelene Idler [facility], [facility] 12/25/2020 2:09 PM

## 2021-04-08 IMAGING — DX DG CHEST 1V PORT
1 series · 1 of 1 positions shown · non-contrast
Comparison: None.

CLINICAL DATA: Tachypnea

EXAM:
PORTABLE CHEST 1 VIEW

[chest ap]
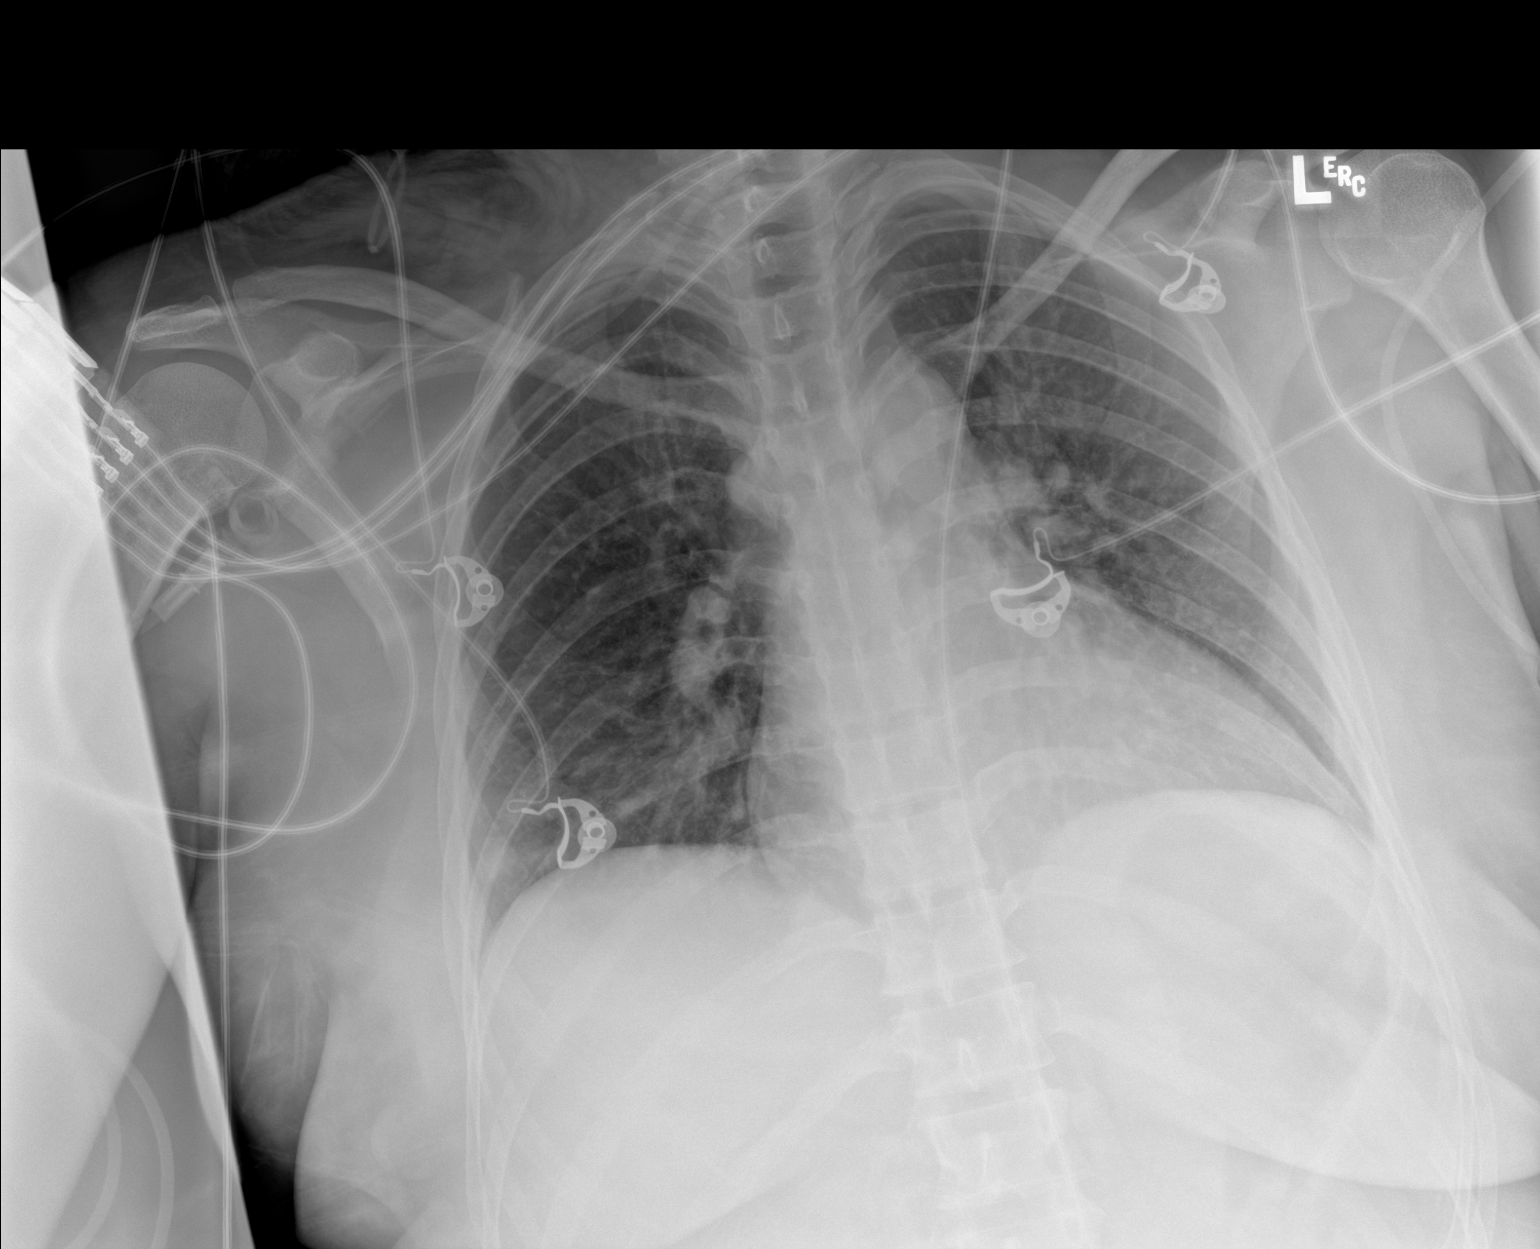

[1 of 1 positions shown; findings below may reference images not displayed]

FINDINGS: Mild airways thickening is present with some hazy interstitial
opacities most pronounced in the left lung. No pneumothorax or
effusion. Cardiomediastinal contours are unremarkable. No acute
osseous or soft tissue abnormality.
IMPRESSION: Airway thickening with some hazy interstitial opacities, consistent
with airways inflammation likely with some atelectasis.

## 2021-04-08 IMAGING — CT CT ANGIO CHEST
2 of 6 series · 19 of 46 positions shown · IV contrast (omnipaque)
Comparison: Chest x-ray from earlier today

CLINICAL DATA: Tachypnea.  C-section 2 hours ago

EXAM:
CT ANGIOGRAPHY CHEST WITH CONTRAST
TECHNIQUE: Multidetector CT imaging of the chest was performed using the
standard protocol during bolus administration of intravenous
contrast. Multiplanar CT image reconstructions and MIPs were
obtained to evaluate the vascular anatomy.
CONTRAST:  75mL OMNIPAQUE IOHEXOL 350 MG/ML SOLN

[Series 5: thins · axial · 0.64mm/px · z∈[-937,-732]mm · 16 of 225 slices shown]
[im 10/225  lung]
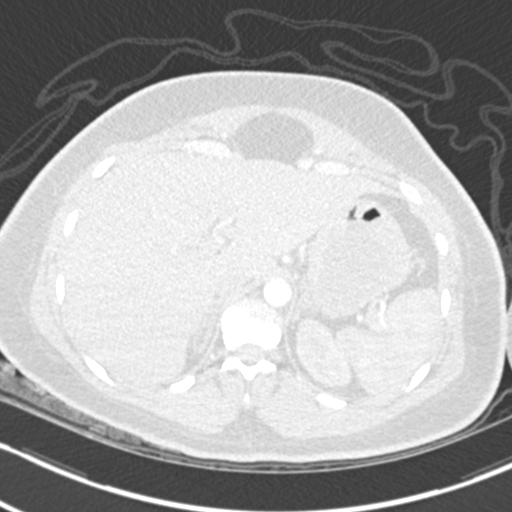
[im 30/225  soft-tissue]
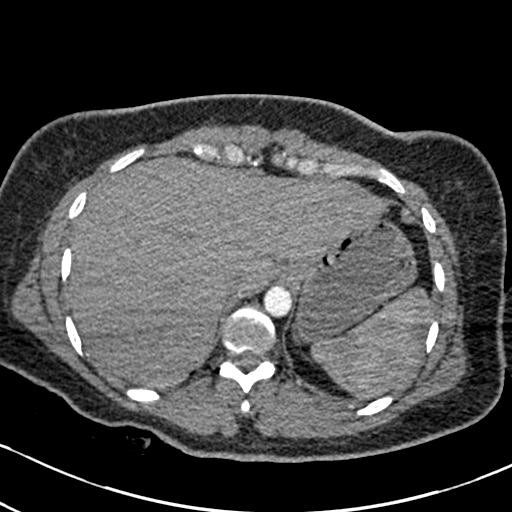
[im 39/225  lung]
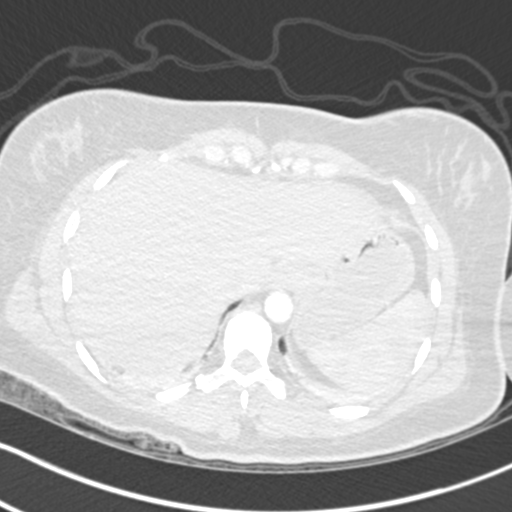
[im 49/225  soft-tissue]
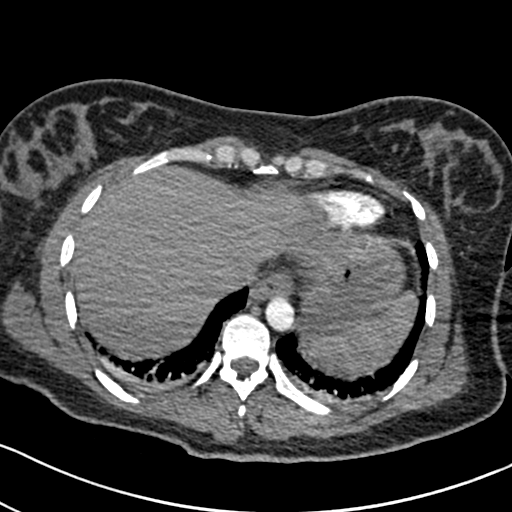
[im 69/225  lung]
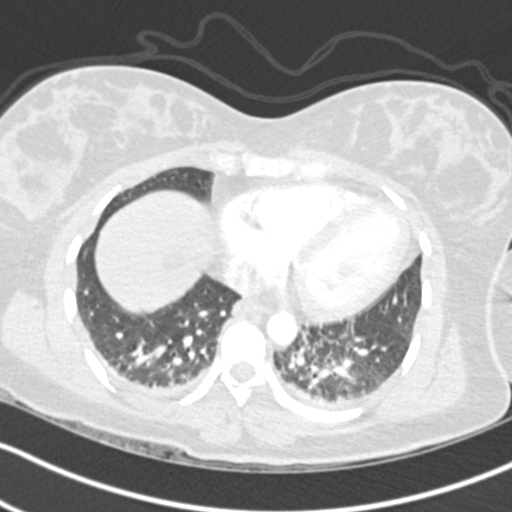
[im 78/225  soft-tissue]
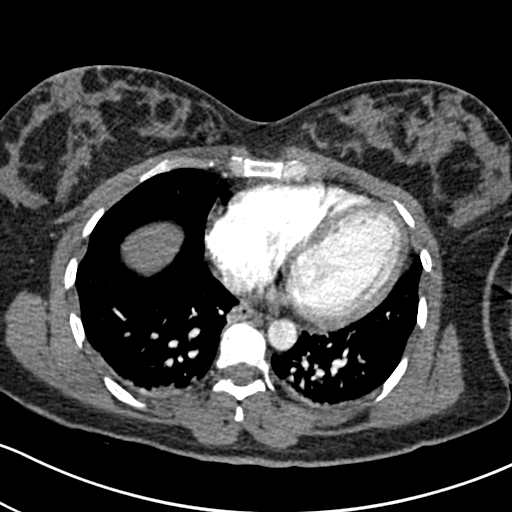
[im 88/225  lung]
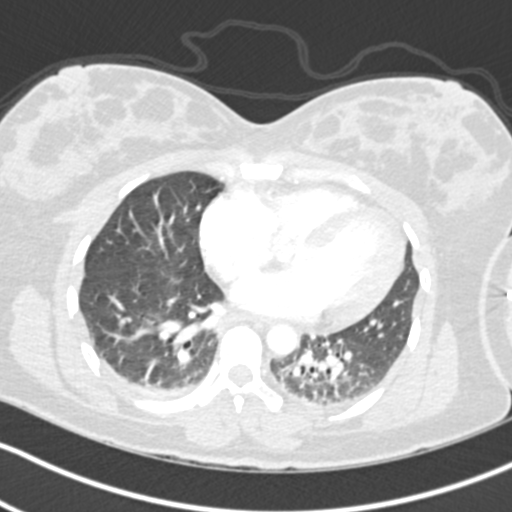
[im 108/225  soft-tissue]
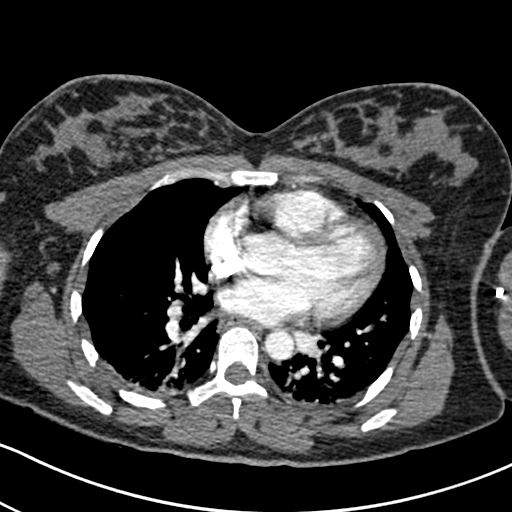
[im 117/225  lung]
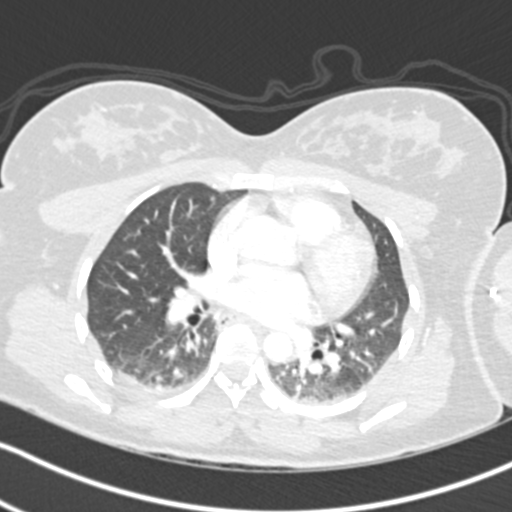
[im 137/225  soft-tissue]
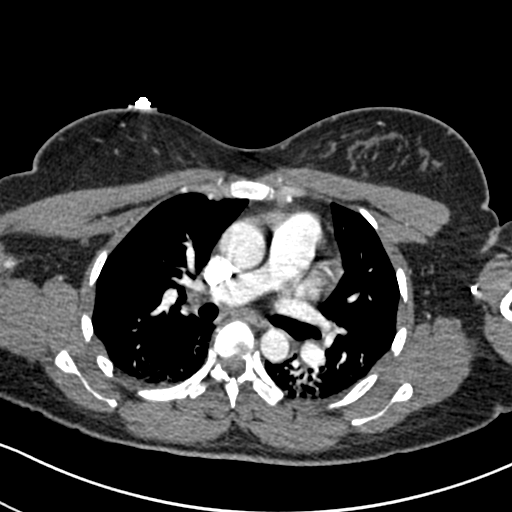
[im 147/225  lung]
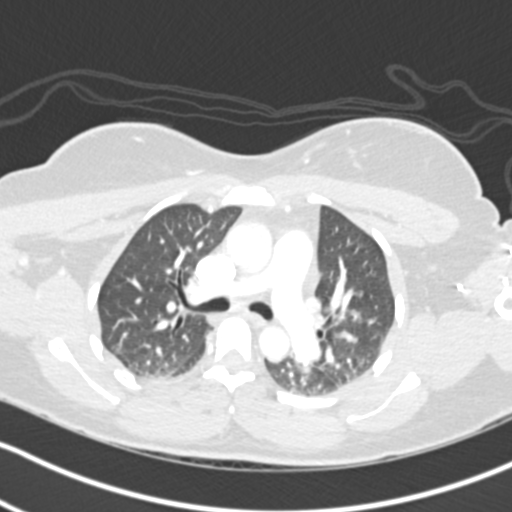
[im 156/225  soft-tissue]
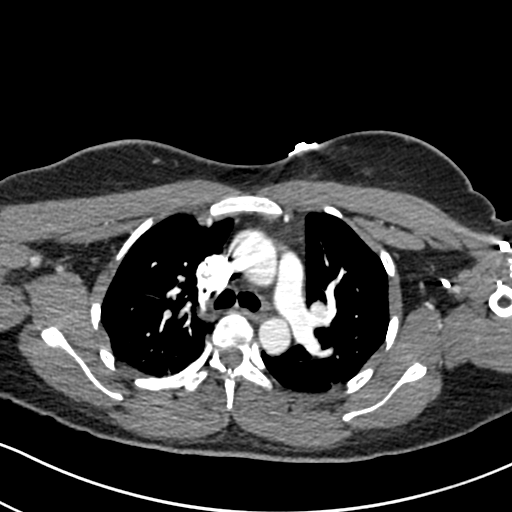
[im 176/225  lung]
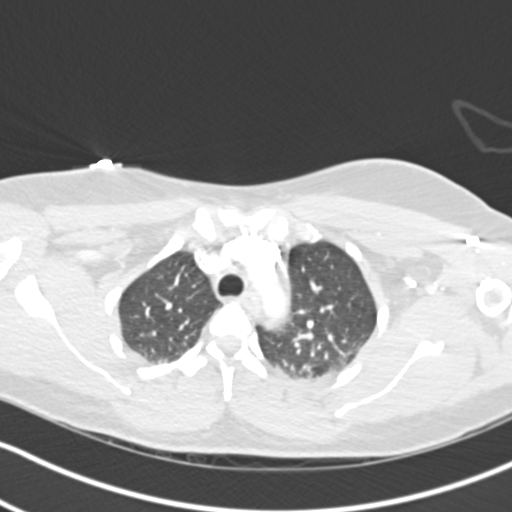
[im 186/225  soft-tissue]
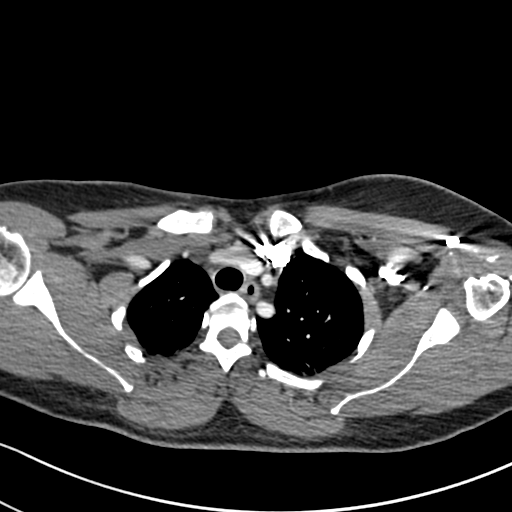
[im 195/225  lung]
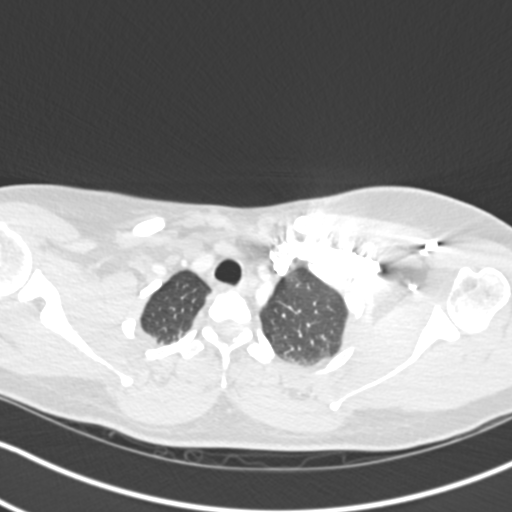
[im 215/225  soft-tissue]
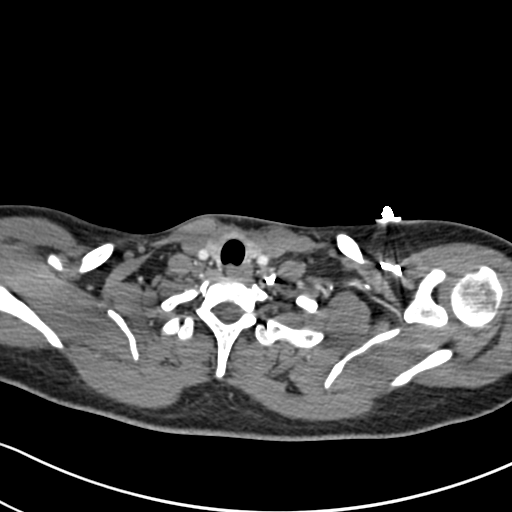

[Series 7: coronal mpr · coronal · 0.48mm/px · 3 of 71 slices shown]
[im 18/71  soft-tissue]
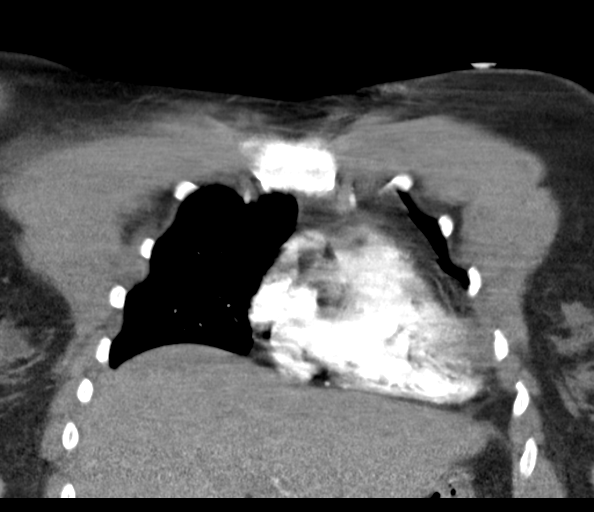
[im 36/71  soft-tissue]
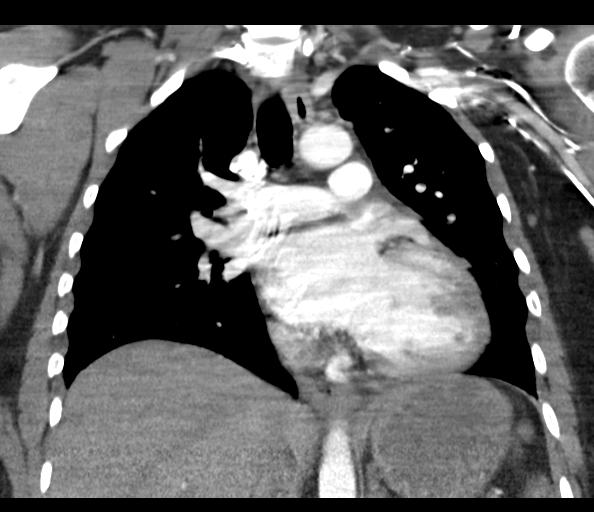
[im 53/71  soft-tissue]
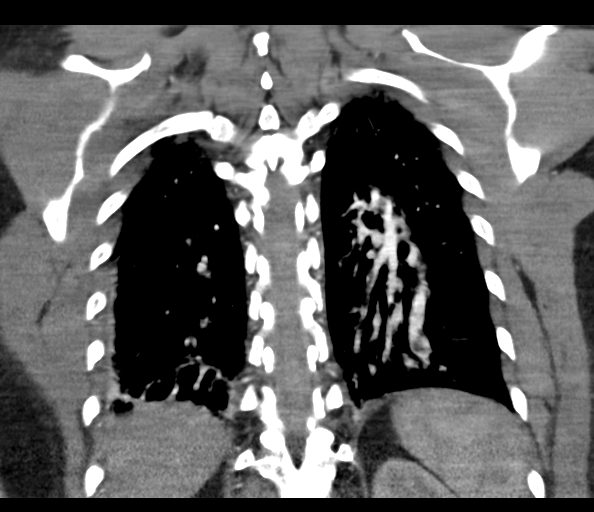

[19 of 46 positions shown; findings below may reference images not displayed]

FINDINGS: Cardiovascular: Normal heart size. No pericardial effusion. No
pulmonary artery filling defect. Normal aorta.

Mediastinum/Nodes: Negative for adenopathy or mass.

Lungs/Pleura: Mild dependent atelectasis. There is no edema,
consolidation, effusion, or pneumothorax.

Upper Abdomen: Small cystic density in the liver.

Musculoskeletal: Negative

Review of the MIP images confirms the above findings.
IMPRESSION: 1. Negative for pulmonary embolism or other acute finding.
2. Mild atelectasis.

## 2024-05-10 ENCOUNTER — Encounter: Payer: Self-pay | Admitting: Obstetrics and Gynecology

## 2024-06-07 ENCOUNTER — Encounter: Admitting: Obstetrics and Gynecology

## 2024-08-22 ENCOUNTER — Ambulatory Visit (INDEPENDENT_AMBULATORY_CARE_PROVIDER_SITE_OTHER): Admitting: Podiatry

## 2024-08-22 DIAGNOSIS — Z79899 Other long term (current) drug therapy: Secondary | ICD-10-CM

## 2024-08-22 DIAGNOSIS — B351 Tinea unguium: Secondary | ICD-10-CM

## 2024-08-22 NOTE — Progress Notes (Unsigned)
 Subjective:  Patient ID: Destiny Allen, female    DOB: [birth year],  MRN: [id number]  Chief Complaint  Patient presents with   Nail Problem    Nail fungus    38 y.o. female presents with the above complaint.  Patient presents for right 4th and 5th digit thickening likely dystrophic mycotic nail.  She states ongoing for quite some time.  She has got nail fungus.  She has not seen MRIs prior to seeing me denies any other acute complaints would like to discuss treatment options for his 0-10 pain scale.  She started over-the-counter treatment options which has not helped   Review of Systems: Negative except as noted in the HPI. Denies N/V/F/Ch.  Past Medical History:  Diagnosis Date   Infertility associated with anovulation    Ovarian cyst    Postpartum care following cesarean delivery 08/30/2019   Preterm labor 08/28/2019    Current Outpatient Medications:    terbinafine (LAMISIL) 250 MG tablet, Take 1 tablet (250 mg total) by mouth daily., Disp: 90 tablet, Rfl: 0   calcium acetate, Phos Binder, (PHOSLYRA) 667 MG/5ML SOLN, Take by mouth 3 (three) times daily with meals., Disp: , Rfl:    ferrous sulfate 325 (65 FE) MG EC tablet, Take 325 mg by mouth 3 (three) times daily with meals., Disp: , Rfl:    Norethindrone -Ethinyl Estradiol-Fe Biphas (LO LOESTRIN FE ) 1 MG-10 MCG / 10 MCG tablet, Take 1 tablet by mouth daily., Disp: 84 tablet, Rfl: 3   Omega-3 Fatty Acids (FISH OIL) 1000 MG CAPS, Take by mouth., Disp: , Rfl:    PRENATAL 28-0.8 MG TABS, Take by mouth., Disp: , Rfl:   Social History   Tobacco Use  Smoking Status Never  Smokeless Tobacco Never    No Known Allergies Objective:  There were no vitals filed for this visit. There is no height or weight on file to calculate BMI. Constitutional Well developed. Well nourished.  Vascular Dorsalis pedis pulses palpable bilaterally. Posterior tibial pulses palpable bilaterally. Capillary refill normal to all digits.  No cyanosis or  clubbing noted. Pedal hair growth normal.  Neurologic Normal speech. Oriented to person, place, and time. Epicritic sensation to light touch grossly present bilaterally.  Dermatologic Nails thickened elongated dystrophic mycotic toenails x 2 Skin within normal limits  Orthopedic: Normal joint ROM without pain or crepitus bilaterally. No visible deformities. No bony tenderness.   Radiographs: None Assessment:   1. Long-term use of high-risk medication   2. Nail fungus   3. Onychomycosis due to dermatophyte    Plan:  Patient was evaluated and treated and all questions answered.  Right 4th and 5th digit onychomycosis -Educated the patient on the etiology of onychomycosis and various treatment options associated with improving the fungal load.  I explained to the patient that there is 3 treatment options available to treat the onychomycosis including topical, p.o., laser treatment.  Patient elected to undergo p.o. options with Lamisil/terbinafine therapy.  In order for me to start the medication therapy, I explained to the patient the importance of evaluating the liver and obtaining the liver function test.  Once the liver function test comes back normal I will start him on 39-month course of Lamisil therapy.  Patient understood all risk and would like to proceed with Lamisil therapy.  I have asked the patient to immediately stop the Lamisil therapy if she has any reactions to it and call the office or go to the emergency room right away.  Patient states understanding  No follow-ups on file.

## 2024-08-23 LAB — HEPATIC FUNCTION PANEL
ALT: 13 IU/L (ref 0–32)
AST: 14 IU/L (ref 0–40)
Albumin: 4.4 g/dL (ref 3.9–4.9)
Alkaline Phosphatase: 63 IU/L (ref 41–116)
Bilirubin Total: 0.5 mg/dL (ref 0.0–1.2)
Bilirubin, Direct: 0.15 mg/dL (ref 0.00–0.40)
Total Protein: 6.7 g/dL (ref 6.0–8.5)

## 2024-08-23 MED ORDER — TERBINAFINE HCL 250 MG PO TABS: 250.0000 mg | ORAL_TABLET | Freq: Every day | ORAL | 0 refills | Status: AC

## 2024-12-26 ENCOUNTER — Ambulatory Visit: Admitting: Podiatry
